# Patient Record
Sex: Male | Born: 1965 | Race: White | Hispanic: No | Marital: Single | Smoking: Current every day smoker
Health system: Southern US, Community
[De-identification: ages and names within clinical notes are randomized; demographics above are authoritative.]

---

## 2014-02-13 ENCOUNTER — Encounter (HOSPITAL_COMMUNITY): Payer: Self-pay | Admitting: *Deleted

## 2014-02-13 ENCOUNTER — Emergency Department (HOSPITAL_COMMUNITY)
Admission: EM | Admit: 2014-02-13 | Discharge: 2014-02-14 | Disposition: A | Discharge status: Home / Self Care | Payer: Self-pay | Attending: Emergency Medicine | Admitting: Emergency Medicine

## 2014-02-13 DIAGNOSIS — S0081XA Abrasion of other part of head, initial encounter: Secondary | ICD-10-CM | POA: Insufficient documentation

## 2014-02-13 DIAGNOSIS — Z720 Tobacco use: Secondary | ICD-10-CM | POA: Insufficient documentation

## 2014-02-13 DIAGNOSIS — Y9389 Activity, other specified: Secondary | ICD-10-CM | POA: Insufficient documentation

## 2014-02-13 DIAGNOSIS — M79645 Pain in left finger(s): Secondary | ICD-10-CM

## 2014-02-13 DIAGNOSIS — Y998 Other external cause status: Secondary | ICD-10-CM | POA: Insufficient documentation

## 2014-02-13 DIAGNOSIS — Z23 Encounter for immunization: Secondary | ICD-10-CM | POA: Insufficient documentation

## 2014-02-13 DIAGNOSIS — T148XXA Other injury of unspecified body region, initial encounter: Secondary | ICD-10-CM

## 2014-02-13 DIAGNOSIS — Y9289 Other specified places as the place of occurrence of the external cause: Secondary | ICD-10-CM | POA: Insufficient documentation

## 2014-02-13 DIAGNOSIS — S0083XA Contusion of other part of head, initial encounter: Secondary | ICD-10-CM | POA: Insufficient documentation

## 2014-02-13 DIAGNOSIS — S60312A Abrasion of left thumb, initial encounter: Secondary | ICD-10-CM | POA: Insufficient documentation
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
## 2014-02-14 ENCOUNTER — Emergency Department (HOSPITAL_COMMUNITY): Payer: Self-pay

## 2014-02-14 MED ORDER — HYDROCODONE-ACETAMINOPHEN 5-325 MG PO TABS
1.0000 | ORAL_TABLET | Freq: Once | ORAL | Status: AC
  Administered 2014-02-14: 1 via ORAL
  Filled 2014-02-14: qty 1

## 2014-02-14 MED ORDER — HYDROCODONE-ACETAMINOPHEN 5-325 MG PO TABS: 1.0000 | ORAL_TABLET | Freq: Four times a day (QID) | ORAL | Status: DC | PRN

## 2014-02-14 MED ORDER — TETANUS-DIPHTH-ACELL PERTUSSIS 5-2.5-18.5 LF-MCG/0.5 IM SUSP
0.5000 mL | Freq: Once | INTRAMUSCULAR | Status: AC
  Administered 2014-02-14: 0.5 mL via INTRAMUSCULAR
  Filled 2014-02-14: qty 0.5

## 2014-02-14 MED ORDER — BACITRACIN ZINC 500 UNIT/GM EX OINT: 1.0000 "application " | TOPICAL_OINTMENT | Freq: Two times a day (BID) | CUTANEOUS | Status: DC
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
## 2014-02-14 NOTE — ED Notes (Signed)
Wounds to pt hand and head cleaned with sterile water and clean gauze applied.

## 2014-02-14 NOTE — ED Notes (Signed)
Ortho tech at bedside 

## 2014-02-14 NOTE — Progress Notes (Signed)
Orthopedic Tech Progress Note Patient Details:  Madelyn FlavorsLarry XXXMilliken [birth year] [id number]  Ortho Devices Type of Ortho Device: Thumb spica splint Ortho Device/Splint Interventions: Application   Haskell Flirtewsome, Manpreet Kemmer M 02/14/2014, 3:03 AM
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
## 2018-03-25 ENCOUNTER — Emergency Department (HOSPITAL_COMMUNITY): Payer: Self-pay

## 2018-03-25 ENCOUNTER — Emergency Department (HOSPITAL_COMMUNITY)
Admission: EM | Admit: 2018-03-25 | Discharge: 2018-03-25 | Disposition: A | Discharge status: Home / Self Care | Payer: Self-pay | Attending: Emergency Medicine | Admitting: Emergency Medicine

## 2018-03-25 DIAGNOSIS — Z23 Encounter for immunization: Secondary | ICD-10-CM | POA: Insufficient documentation

## 2018-03-25 DIAGNOSIS — M10061 Idiopathic gout, right knee: Secondary | ICD-10-CM | POA: Insufficient documentation

## 2018-03-25 DIAGNOSIS — Z79899 Other long term (current) drug therapy: Secondary | ICD-10-CM | POA: Insufficient documentation

## 2018-03-25 DIAGNOSIS — F17200 Nicotine dependence, unspecified, uncomplicated: Secondary | ICD-10-CM | POA: Insufficient documentation

## 2018-03-25 DIAGNOSIS — L98499 Non-pressure chronic ulcer of skin of other sites with unspecified severity: Secondary | ICD-10-CM | POA: Insufficient documentation

## 2018-03-25 LAB — BASIC METABOLIC PANEL
ANION GAP: 11 (ref 5–15)
BUN: 9 mg/dL (ref 6–20)
CHLORIDE: 100 mmol/L (ref 98–111)
CO2: 25 mmol/L (ref 22–32)
Calcium: 9.1 mg/dL (ref 8.9–10.3)
Creatinine, Ser: 0.8 mg/dL (ref 0.61–1.24)
GFR calc Af Amer: >60 mL/min (ref >60)
GFR calc non Af Amer: >60 mL/min (ref >60)
GLUCOSE: 131 mg/dL — AB (ref 70–99)
Potassium: 4.2 mmol/L (ref 3.5–5.1)
Sodium: 136 mmol/L (ref 135–145)

## 2018-03-25 LAB — CBC WITH DIFFERENTIAL/PLATELET
Abs Immature Granulocytes: 0.06 10*3/uL (ref 0.00–0.07)
Basophils Absolute: 0 10*3/uL (ref 0.0–0.1)
Basophils Relative: 0 %
Eosinophils Absolute: 0 10*3/uL (ref 0.0–0.5)
Eosinophils Relative: 0 %
HCT: 43.2 % (ref 39.0–52.0)
Hemoglobin: 14.8 g/dL (ref 13.0–17.0)
Immature Granulocytes: 1 %
Lymphocytes Relative: 7 %
Lymphs Abs: 0.9 10*3/uL (ref 0.7–4.0)
MCH: 34.6 pg — ABNORMAL HIGH (ref 26.0–34.0)
MCHC: 34.3 g/dL (ref 30.0–36.0)
MCV: 100.9 fL — ABNORMAL HIGH (ref 80.0–100.0)
Monocytes Absolute: 1.3 10*3/uL — ABNORMAL HIGH (ref 0.1–1.0)
Monocytes Relative: 10 %
Neutro Abs: 10.8 10*3/uL — ABNORMAL HIGH (ref 1.7–7.7)
Neutrophils Relative %: 82 %
Platelets: 255 10*3/uL (ref 150–400)
RBC: 4.28 MIL/uL (ref 4.22–5.81)
RDW: 13.1 % (ref 11.5–15.5)
WBC: 13.1 10*3/uL — ABNORMAL HIGH (ref 4.0–10.5)
nRBC: 0 % (ref 0.0–0.2)

## 2018-03-25 LAB — SYNOVIAL CELL COUNT + DIFF, W/ CRYSTALS
Lymphocytes-Synovial Fld: 1 % (ref 0–20)
Monocyte-Macrophage-Synovial Fluid: 3 % — ABNORMAL LOW (ref 50–90)
Neutrophil, Synovial: 96 % — ABNORMAL HIGH (ref 0–25)
WBC, Synovial: 33600 /mm3 — ABNORMAL HIGH (ref 0–200)

## 2018-03-25 LAB — URIC ACID: URIC ACID, SERUM: 4.7 mg/dL (ref 3.7–8.6)

## 2018-03-25 LAB — CBG MONITORING, ED: GLUCOSE-CAPILLARY: 102 mg/dL — AB (ref 70–99)

## 2018-03-25 MED ORDER — TETANUS-DIPHTHERIA TOXOIDS TD 5-2 LFU IM INJ
0.5000 mL | INJECTION | Freq: Once | INTRAMUSCULAR | Status: AC
  Administered 2018-03-25: 0.5 mL via INTRAMUSCULAR
  Filled 2018-03-25: qty 0.5

## 2018-03-25 MED ORDER — COLCHICINE 0.6 MG PO TABS: 0.6000 mg | ORAL_TABLET | Freq: Two times a day (BID) | ORAL | 0 refills | Status: DC

## 2018-03-25 MED ORDER — OXYCODONE-ACETAMINOPHEN 5-325 MG PO TABS: 1.0000 | ORAL_TABLET | ORAL | 0 refills | Status: DC | PRN

## 2018-03-25 MED ORDER — LIDOCAINE-EPINEPHRINE (PF) 2 %-1:200000 IJ SOLN
30.0000 mL | Freq: Once | INTRAMUSCULAR | Status: AC
  Administered 2018-03-25: 30 mL via INTRADERMAL
  Filled 2018-03-25: qty 40

## 2018-03-25 MED ORDER — DOXYCYCLINE HYCLATE 100 MG PO CAPS: 100.0000 mg | ORAL_CAPSULE | Freq: Two times a day (BID) | ORAL | 0 refills | Status: DC

## 2018-03-25 MED ORDER — OXYCODONE-ACETAMINOPHEN 5-325 MG PO TABS
2.0000 | ORAL_TABLET | Freq: Once | ORAL | Status: AC
  Administered 2018-03-25: 2 via ORAL
  Filled 2018-03-25: qty 2

## 2018-03-25 MED ORDER — SODIUM CHLORIDE 0.9 % IV SOLN
INTRAVENOUS | Status: DC
  Administered 2018-03-25: 11:00:00 via INTRAVENOUS

## 2018-03-25 NOTE — ED Notes (Signed)
ED Provider at bedside. 
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
## 2018-03-25 NOTE — ED Triage Notes (Signed)
Injury to right ankle and pain now radiating to right knee. +swelling and hot to touch.

## 2018-03-25 NOTE — ED Provider Notes (Signed)
[facility]-EMERGENCY DEPT Provider Note   CSN: [id number] Arrival date & time: 03/25/18  7846    History   Chief Complaint Chief Complaint  Patient presents with  . Joint Swelling    HPI Dalton Kelley is a 53 y.o. male.     53 year old male presents with pain to his right knee and right ankle.  5 days ago patient scraped the inside portion of his right ankle while at work.  Since that time the pain is gone up into his right knee which she characterizes as sharp.  No fever or chills.  Symptoms are worse with standing.  Denies any hip discomfort.  No other joint pain.  Original injury to his inner right ankle has now turned into ulcer but denies any drainage from the wound currently.  Has been medicating with topical antibiotics.     No past medical history on file.  There are no active problems to display for this patient.   No past surgical history on file.      Home Medications    Prior to Admission medications   Medication Sig Start Date End Date Taking? Authorizing Provider  Aspirin-Salicylamide-Caffeine (BC HEADACHE POWDER PO) Take 1 packet by mouth as needed (headache/pain).   Yes [provider]  bacitracin ointment Apply 1 application topically 2 (two) times daily. Patient not taking: Reported on 03/25/2018 02/14/14   [name], MD  HYDROcodone-acetaminophen (NORCO/VICODIN) 5-325 MG per tablet Take 1 tablet by mouth every 6 (six) hours as needed. Patient not taking: Reported on 03/25/2018 02/14/14   [name], MD    Family History No family history on file.  Social History Social History   Tobacco Use  . Smoking status: Current Every Day Smoker  Substance Use Topics  . Alcohol use: Not on file  . Drug use: Not on file     Allergies   Patient has no known allergies.   Review of Systems Review of Systems  All other systems reviewed and are negative.    Physical Exam Updated Vital Signs BP (!) 135/115  (BP Location: Left Arm)   Pulse (!) 109   Temp 97.8 F (36.6 C) (Oral)   Resp 18   SpO2 100%   Physical Exam Vitals signs and nursing note reviewed.  Constitutional:      General: He is not in acute distress.    Appearance: Normal appearance. He is well-developed. He is not toxic-appearing.  HENT:     Head: Normocephalic and atraumatic.  Eyes:     General: Lids are normal.     Conjunctiva/sclera: Conjunctivae normal.     Pupils: Pupils are equal, round, and reactive to light.  Neck:     Musculoskeletal: Normal range of motion and neck supple.     Thyroid: No thyroid mass.     Trachea: No tracheal deviation.  Cardiovascular:     Rate and Rhythm: Normal rate and regular rhythm.     Heart sounds: Normal heart sounds. No murmur. No gallop.   Pulmonary:     Effort: Pulmonary effort is normal. No respiratory distress.     Breath sounds: Normal breath sounds. No stridor. No decreased breath sounds, wheezing, rhonchi or rales.  Abdominal:     General: Bowel sounds are normal. There is no distension.     Palpations: Abdomen is soft.     Tenderness: There is no abdominal tenderness. There is no rebound.  Musculoskeletal:        General: No tenderness.  Right knee: He exhibits decreased range of motion, swelling and effusion.       Feet:  Skin:    General: Skin is warm and dry.     Findings: No abrasion or rash.  Neurological:     Mental Status: He is alert and oriented to person, place, and time.     GCS: GCS eye subscore is 4. GCS verbal subscore is 5. GCS motor subscore is 6.     Cranial Nerves: No cranial nerve deficit.     Sensory: No sensory deficit.  Psychiatric:        Speech: Speech normal.        Behavior: Behavior normal.      ED Treatments / Results  Labs (all labs ordered are listed, but only abnormal results are displayed) Labs Reviewed  BODY FLUID CULTURE  GRAM STAIN  CBC WITH DIFFERENTIAL/PLATELET  BASIC METABOLIC PANEL  SYNOVIAL CELL COUNT + DIFF,  W/ CRYSTALS  GLUCOSE, BODY FLUID OTHER  URIC ACID  CBG MONITORING, ED    EKG None  Radiology No results found.  Procedures .Joint Aspiration/Arthrocentesis Date/Time: 03/25/2018 11:44 AM Performed by: Lorre Nick, MD Authorized by: Lorre Nick, MD   Consent:    Consent obtained:  Verbal   Consent given by:  Patient   Risks discussed:  Infection   Alternatives discussed:  No treatment Location:    Location:  Knee   Knee:  R knee Anesthesia (see MAR for exact dosages):    Anesthesia method:  Local infiltration   Local anesthetic:  Lidocaine 1% w/o epi Procedure details:    Preparation: Patient was prepped and draped in usual sterile fashion     Needle gauge:  18 G   Ultrasound guidance: no     Approach:  Lateral   Aspirate amount:  35ml   Aspirate characteristics:  Yellow   Steroid injected: no     Specimen collected: yes   Post-procedure details:    Dressing:  Adhesive bandage   Patient tolerance of procedure:  Tolerated well, no immediate complications   (including critical care time)  Medications Ordered in ED Medications  lidocaine-EPINEPHrine (XYLOCAINE W/EPI) 2 %-1:200000 (PF) injection 30 mL (has no administration in time range)  0.9 %  sodium chloride infusion (has no administration in time range)  tetanus & diphtheria toxoids (adult) (TENIVAC) injection 0.5 mL (has no administration in time range)     Initial Impression / Assessment and Plan / ED Course  I have reviewed the triage vital signs and the nursing notes.  Pertinent labs & imaging results that were available during my care of the patient were reviewed by me and considered in my medical decision making (see chart for details).        Patients laceration consistent with gout.  Treated with pain medications here.  Will place patient on colchicine as well as Percocet.  We will also treat patient's ulcer with a course of oral antibiotics and give referral to the wound care  center  Final Clinical Impressions(s) / ED Diagnoses   Final diagnoses:  None    ED Discharge Orders    None       Lorre Nick, MD 03/25/18 1529

## 2018-03-26 LAB — GLUCOSE, BODY FLUID OTHER: GLUCOSE, BODY FLUID OTHER: 40 mg/dL

## 2018-03-28 LAB — BODY FLUID CULTURE
Culture: NO GROWTH
Gram Stain: NONE SEEN
Special Requests: NORMAL

## 2020-06-27 IMAGING — CR RIGHT KNEE - COMPLETE 4+ VIEW
3 series · 3 of 3 positions shown · non-contrast
Comparison: None.

CLINICAL DATA: Right knee pain.

EXAM:
RIGHT KNEE - COMPLETE 4+ VIEW

[x knee ap right (1 of 3)]
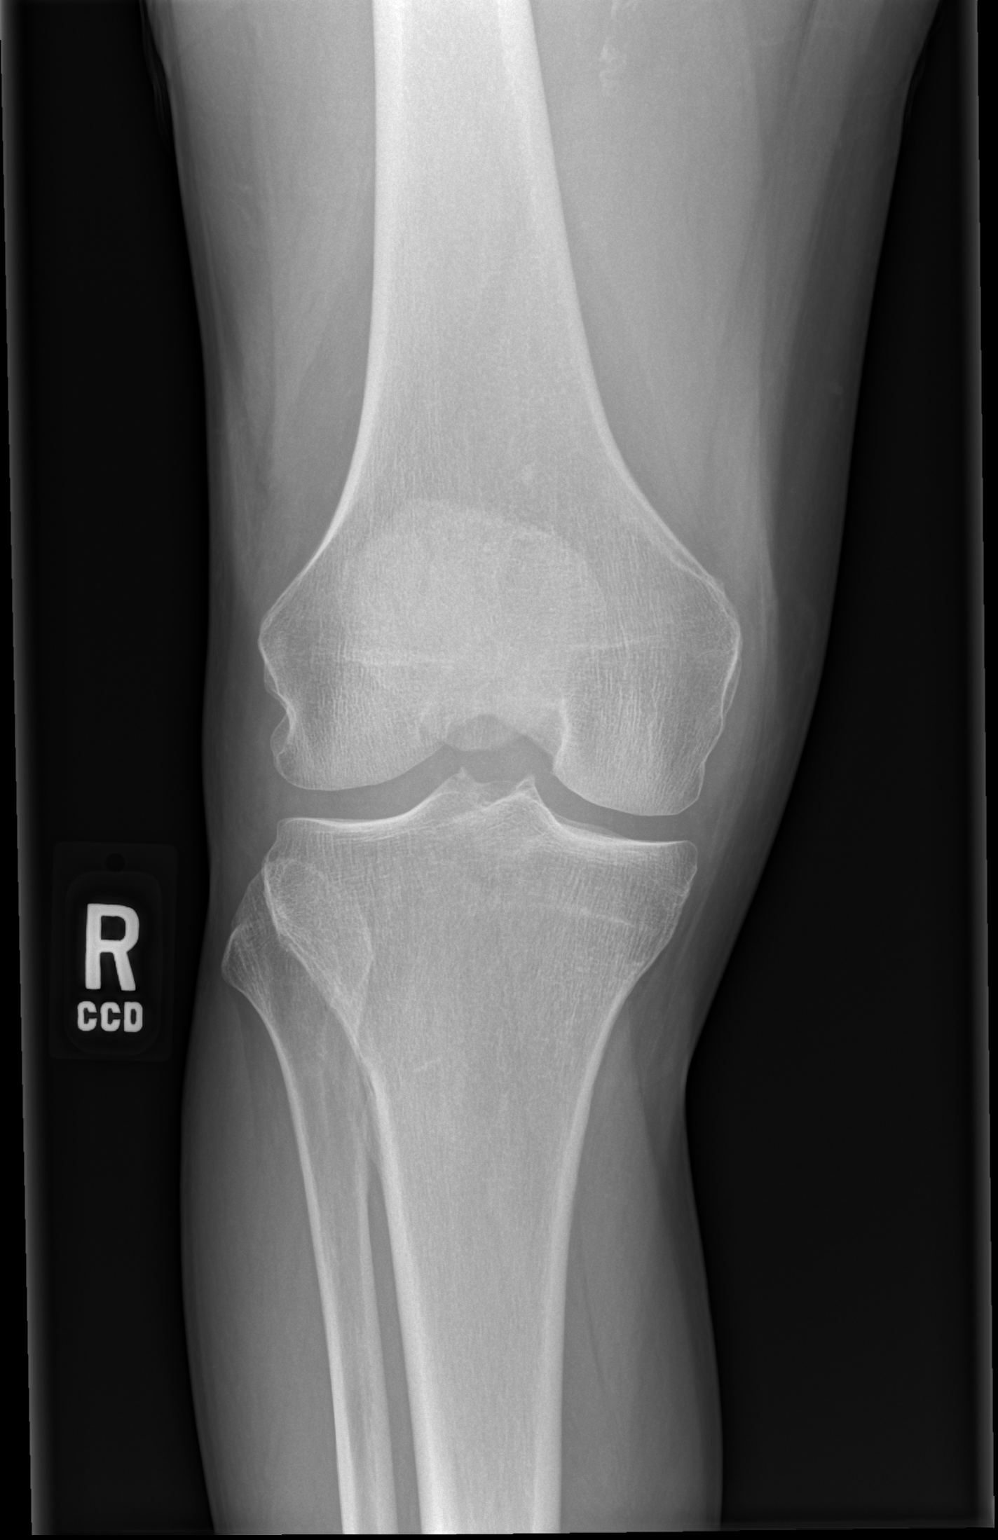

[x knee ap right (2 of 3)]
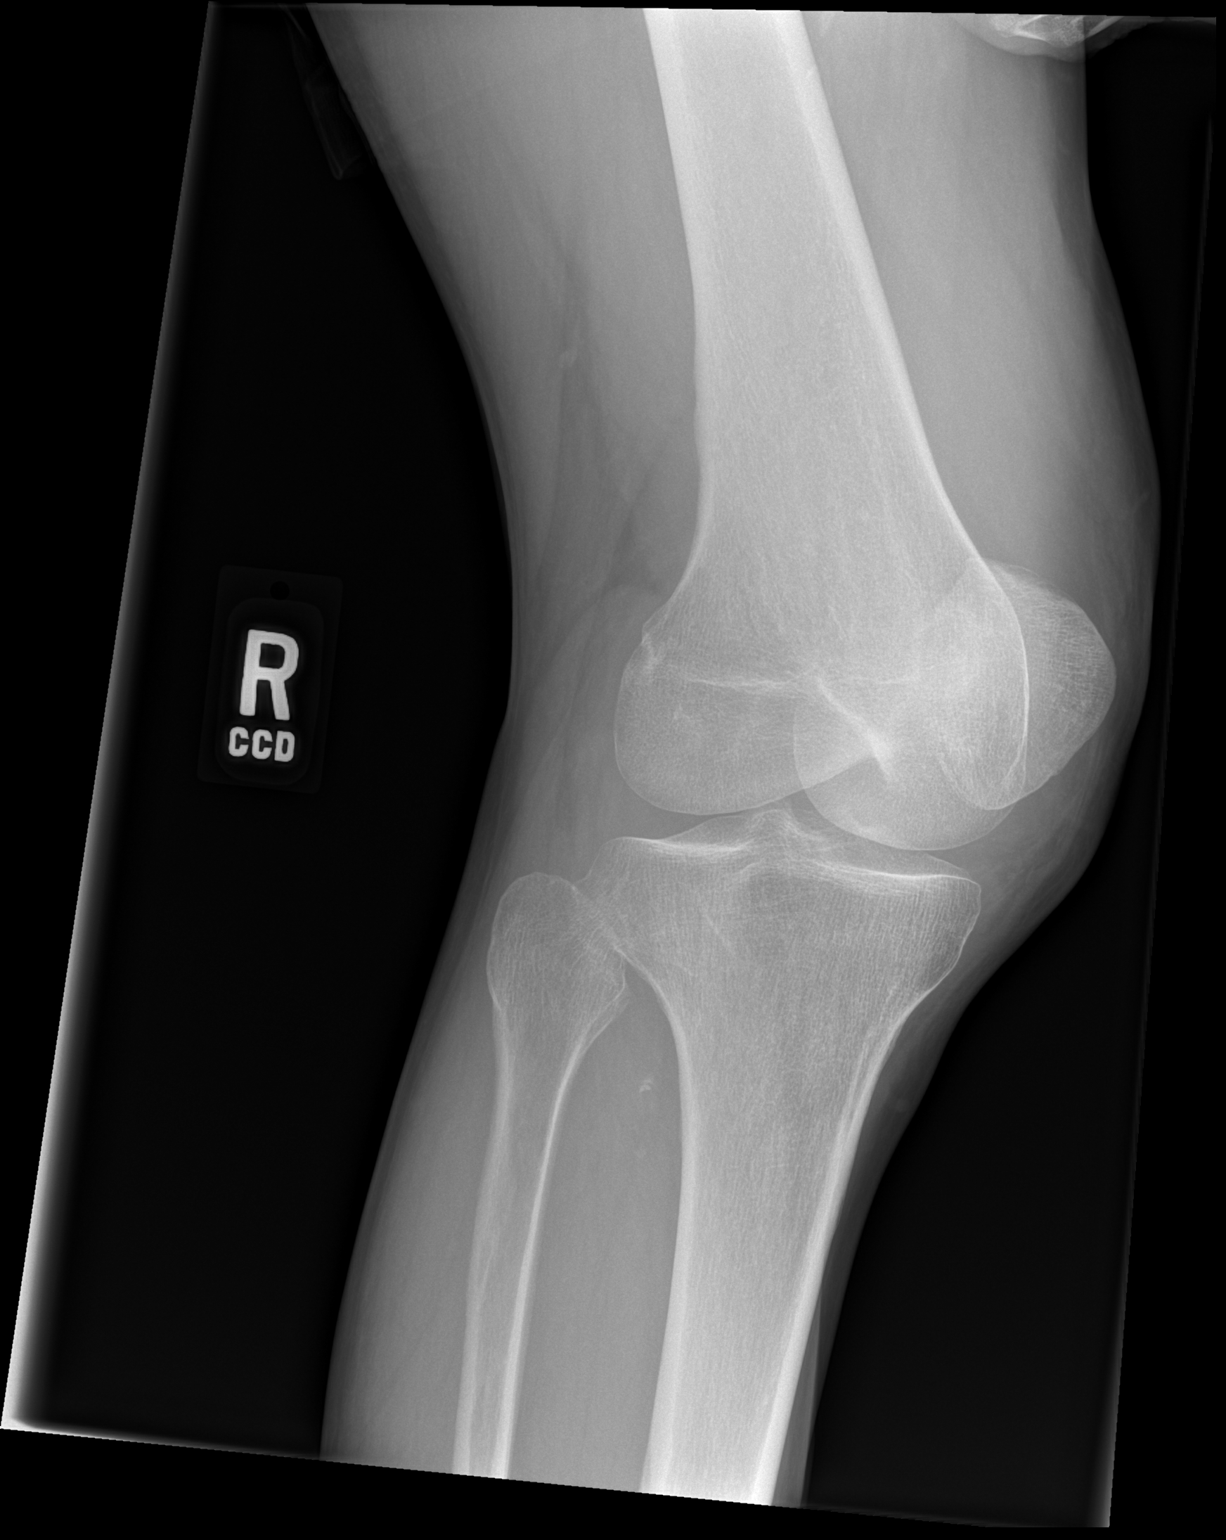

[x knee ap right (3 of 3)]
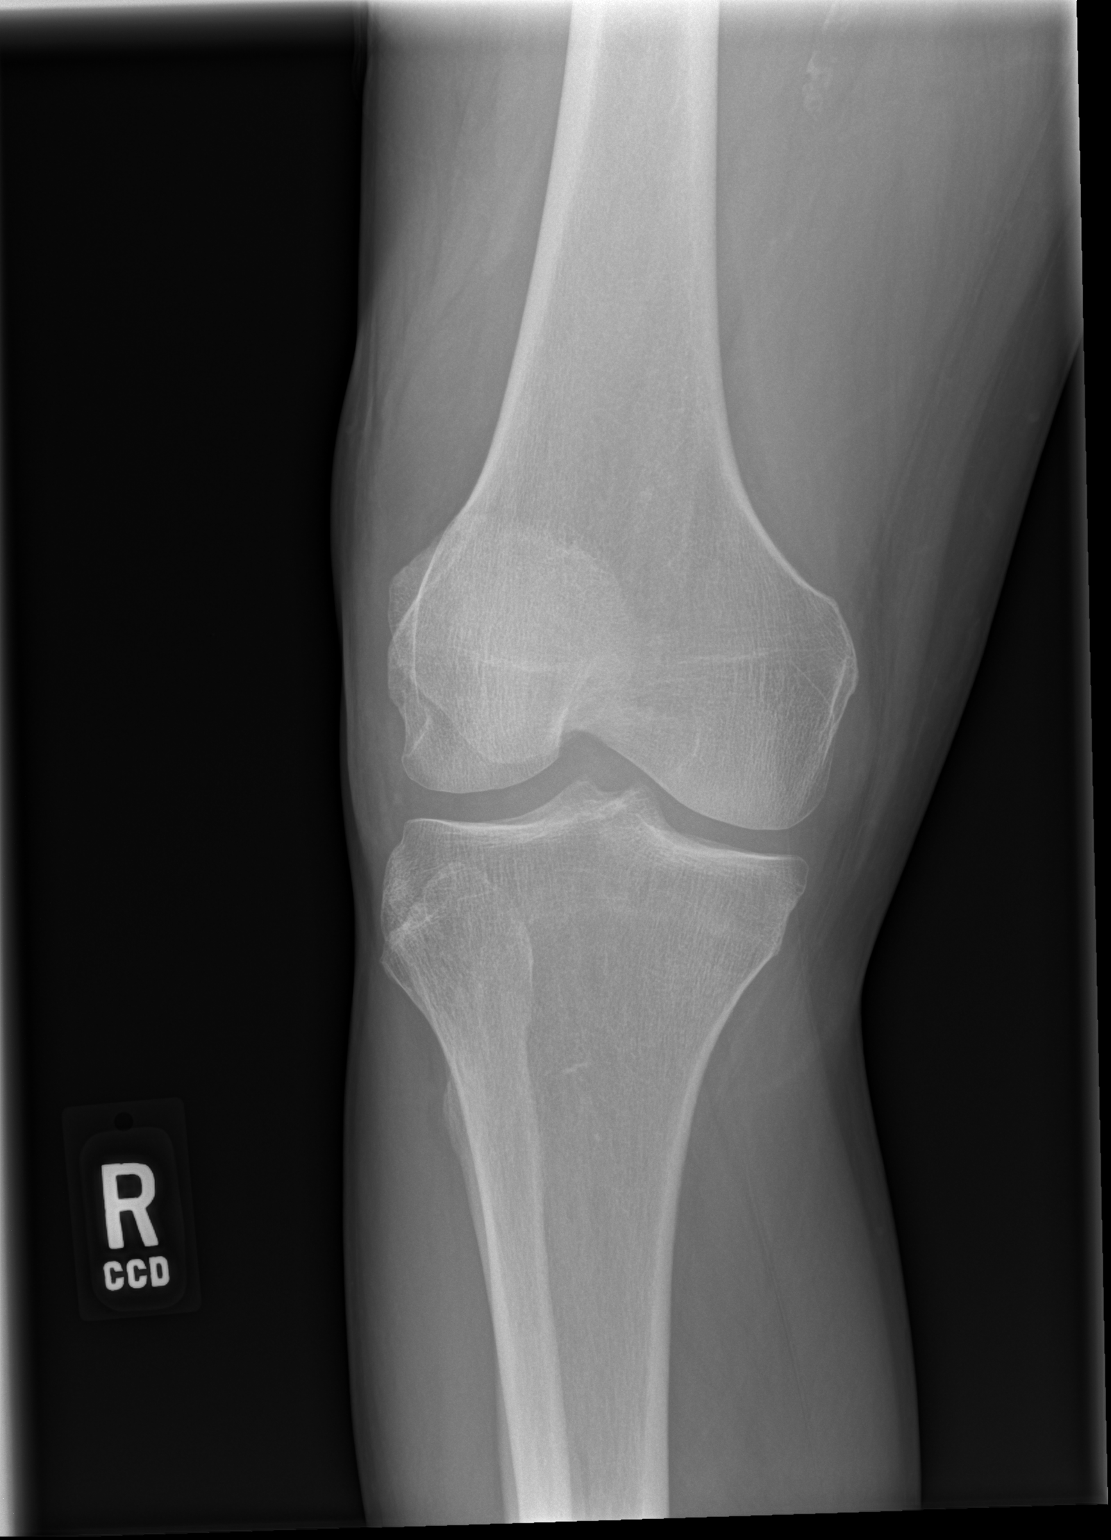

[3 of 3 positions shown; findings below may reference images not displayed]

FINDINGS: The joint spaces are maintained. No acute bony findings,
osteochondral lesion or degenerative changes. There is a large joint
effusion. Internal derangement would be possible. Vascular
calcifications are noted, advanced for age.
IMPRESSION: No acute bony findings or significant degenerative changes.

Large joint effusion.

## 2020-06-27 IMAGING — CR RIGHT ANKLE - COMPLETE 3+ VIEW
3 series · 3 of 3 positions shown · non-contrast
Comparison: None.

CLINICAL DATA: Right ankle pain.

EXAM:
RIGHT ANKLE - COMPLETE 3+ VIEW

[x ankle ap right]
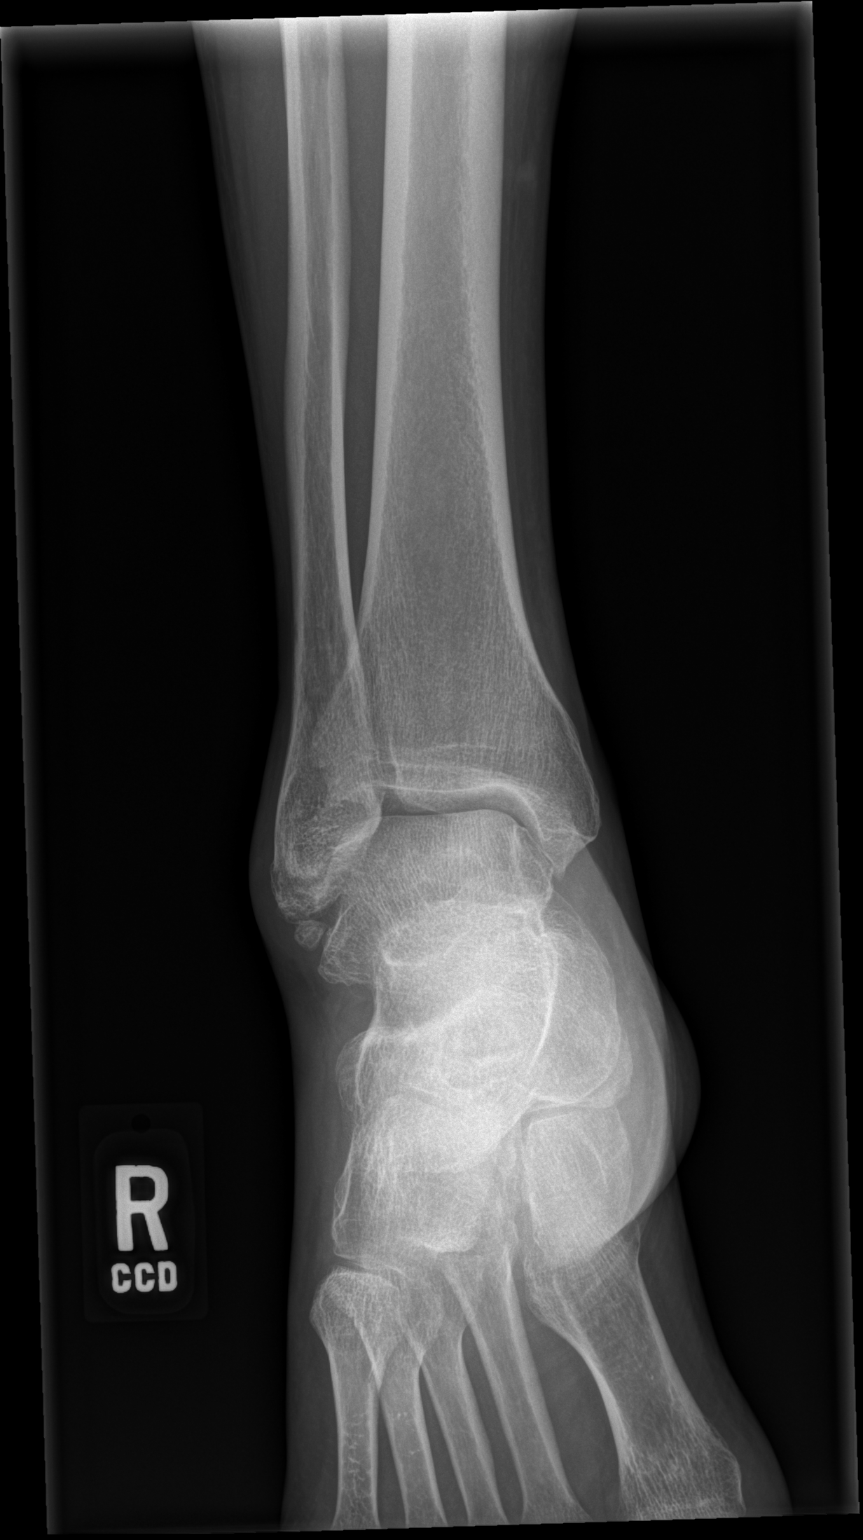

[x ankle obl right]
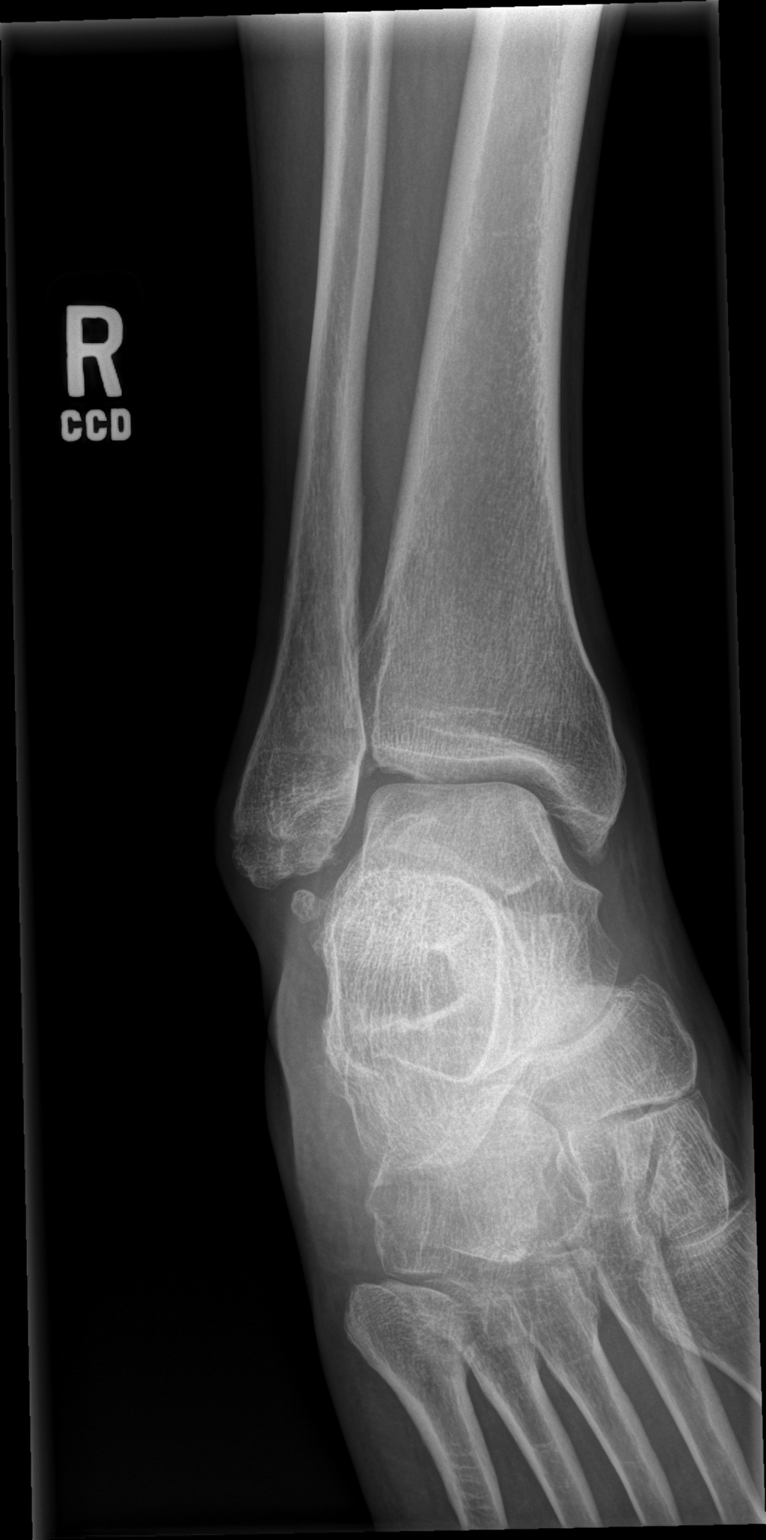

[x ankle lat right]
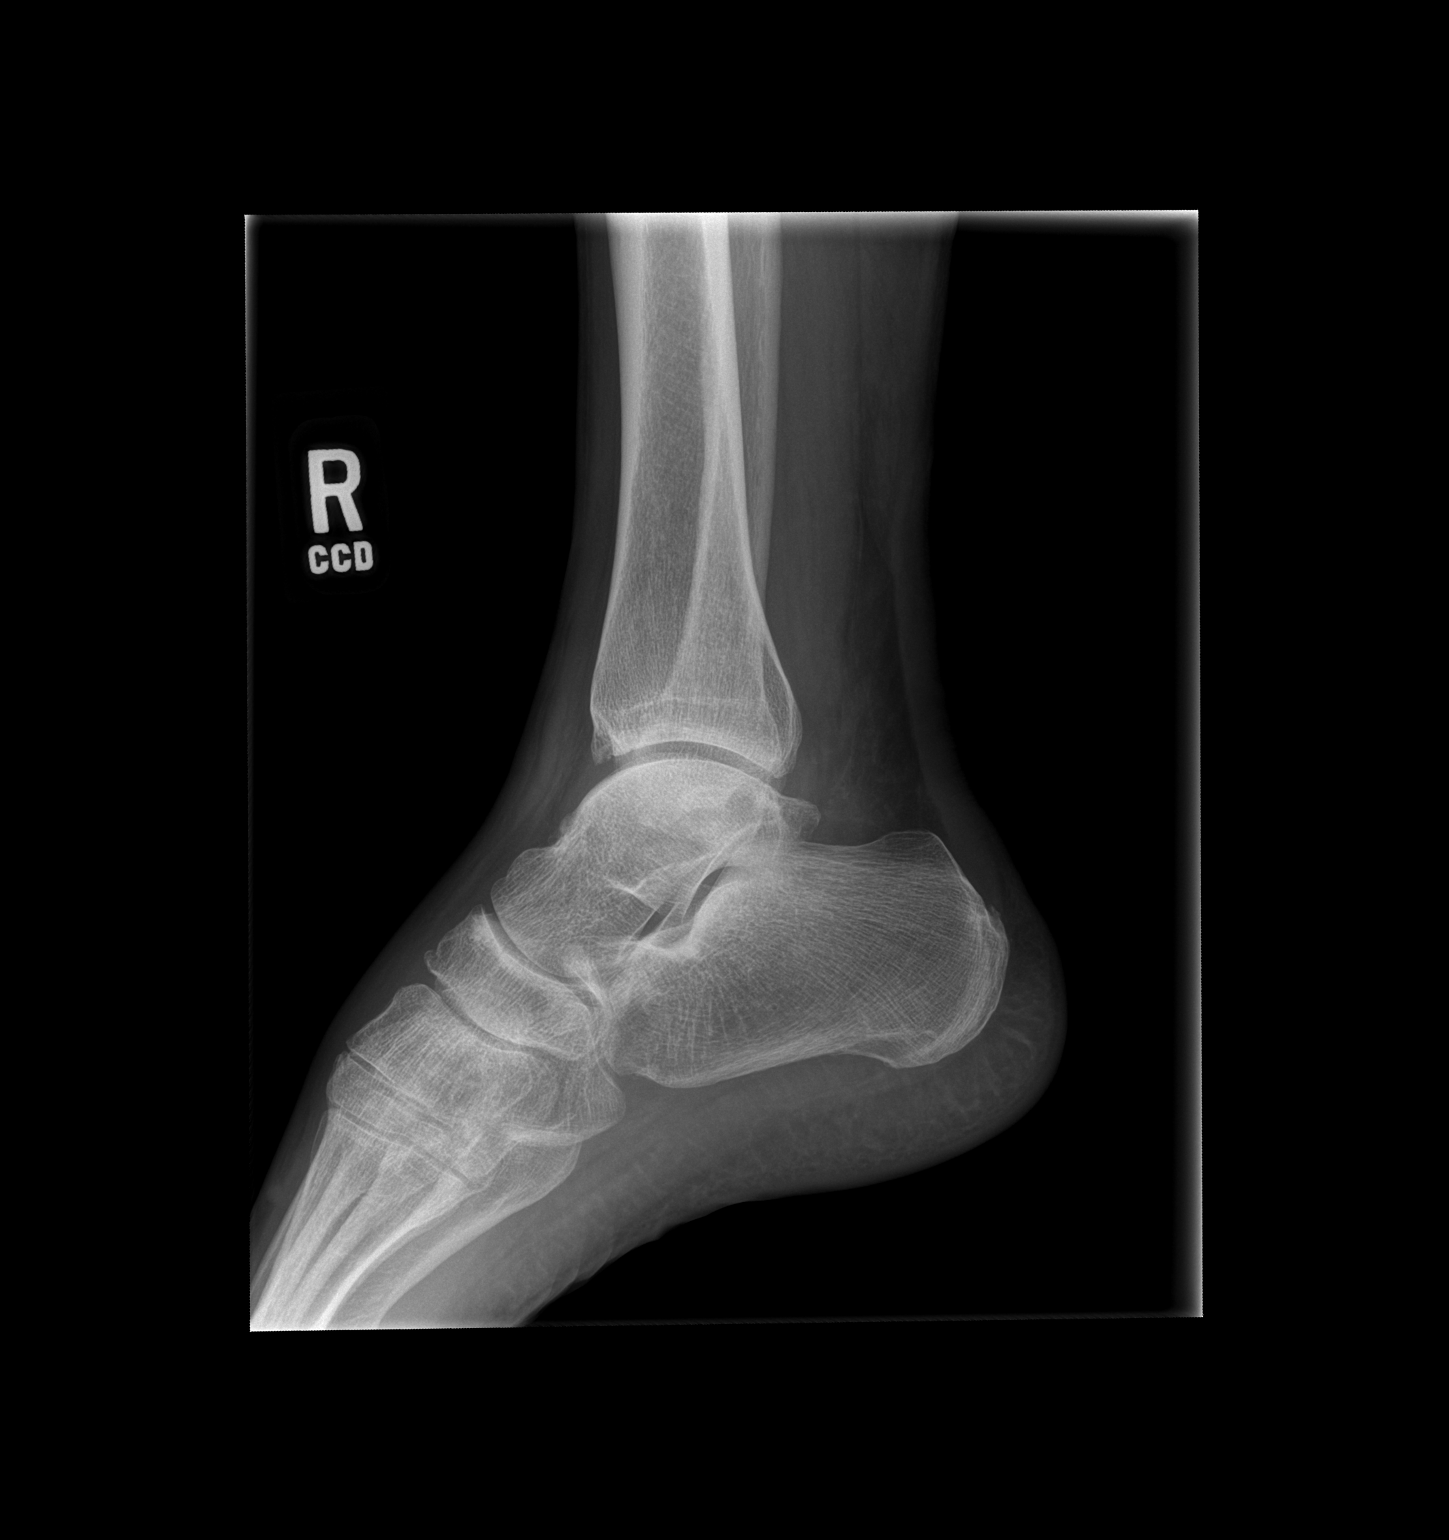

[3 of 3 positions shown; findings below may reference images not displayed]

FINDINGS: The ankle mortise is maintained. There are mild ankle joint
degenerative changes. Rounded density along the distal tip of the
lateral malleolus likely an old avulsion fracture or unfused
secondary ossification center. Vague lucency horizontally through
the lower aspect of the lateral malleolus suspicious for a healed or
healing fracture. I do not think this is an acute fracture and there
is no adjacent soft tissue swelling.

The mid and hindfoot bony structures are intact. Mild degenerative
changes.
IMPRESSION: 1. Ankle joint degenerative changes.
2. Suspect healed or healing distal fibular fracture. No definite
acute fracture.

## 2022-09-15 ENCOUNTER — Encounter (HOSPITAL_COMMUNITY): Payer: Self-pay | Admitting: Emergency Medicine

## 2022-09-15 ENCOUNTER — Other Ambulatory Visit: Payer: Self-pay

## 2022-09-15 ENCOUNTER — Emergency Department (HOSPITAL_COMMUNITY): Payer: 59

## 2022-09-15 ENCOUNTER — Emergency Department (HOSPITAL_COMMUNITY)
Admission: EM | Admit: 2022-09-15 | Discharge: 2022-09-16 | Disposition: A | Discharge status: Home / Self Care | Payer: 59 | Attending: Emergency Medicine | Admitting: Emergency Medicine

## 2022-09-15 DIAGNOSIS — D692 Other nonthrombocytopenic purpura: Secondary | ICD-10-CM

## 2022-09-15 DIAGNOSIS — M10062 Idiopathic gout, left knee: Secondary | ICD-10-CM

## 2022-09-15 DIAGNOSIS — R062 Wheezing: Secondary | ICD-10-CM | POA: Insufficient documentation

## 2022-09-15 DIAGNOSIS — Z20822 Contact with and (suspected) exposure to covid-19: Secondary | ICD-10-CM | POA: Diagnosis not present

## 2022-09-15 DIAGNOSIS — L03116 Cellulitis of left lower limb: Secondary | ICD-10-CM | POA: Diagnosis not present

## 2022-09-15 DIAGNOSIS — M25562 Pain in left knee: Secondary | ICD-10-CM | POA: Diagnosis present

## 2022-09-15 DIAGNOSIS — K521 Toxic gastroenteritis and colitis: Secondary | ICD-10-CM | POA: Diagnosis not present

## 2022-09-15 DIAGNOSIS — R030 Elevated blood-pressure reading, without diagnosis of hypertension: Secondary | ICD-10-CM | POA: Diagnosis not present

## 2022-09-15 DIAGNOSIS — M25462 Effusion, left knee: Secondary | ICD-10-CM

## 2022-09-15 DIAGNOSIS — F17210 Nicotine dependence, cigarettes, uncomplicated: Secondary | ICD-10-CM | POA: Diagnosis not present

## 2022-09-15 DIAGNOSIS — E876 Hypokalemia: Secondary | ICD-10-CM | POA: Diagnosis not present

## 2022-09-15 DIAGNOSIS — M109 Gout, unspecified: Secondary | ICD-10-CM

## 2022-09-15 LAB — RESP PANEL BY RT-PCR (RSV, FLU A&B, COVID)  RVPGX2
Influenza A by PCR: NEGATIVE
Influenza B by PCR: NEGATIVE
Resp Syncytial Virus by PCR: NEGATIVE
SARS Coronavirus 2 by RT PCR: NEGATIVE

## 2022-09-15 LAB — COMPREHENSIVE METABOLIC PANEL
ALT: 38 U/L (ref 0–44)
AST: 68 U/L — ABNORMAL HIGH (ref 15–41)
Albumin: 3.1 g/dL — ABNORMAL LOW (ref 3.5–5.0)
Alkaline Phosphatase: 111 U/L (ref 38–126)
Anion gap: 14 (ref 5–15)
BUN: 12 mg/dL (ref 6–20)
CO2: 26 mmol/L (ref 22–32)
Calcium: 8.7 mg/dL — ABNORMAL LOW (ref 8.9–10.3)
Chloride: 95 mmol/L — ABNORMAL LOW (ref 98–111)
Creatinine, Ser: 1 mg/dL (ref 0.61–1.24)
GFR, Estimated: >60 mL/min (ref >60)
Glucose, Bld: 103 mg/dL — ABNORMAL HIGH (ref 70–99)
Potassium: 3.3 mmol/L — ABNORMAL LOW (ref 3.5–5.1)
Sodium: 135 mmol/L (ref 135–145)
Total Bilirubin: 0.7 mg/dL (ref 0.3–1.2)
Total Protein: 8.5 g/dL — ABNORMAL HIGH (ref 6.5–8.1)

## 2022-09-15 LAB — PROTIME-INR
INR: 0.9 (ref 0.8–1.2)
Prothrombin Time: 12.8 seconds (ref 11.4–15.2)

## 2022-09-15 LAB — CBC
HCT: 41.8 % (ref 39.0–52.0)
Hemoglobin: 14.4 g/dL (ref 13.0–17.0)
MCH: 35.7 pg — ABNORMAL HIGH (ref 26.0–34.0)
MCHC: 34.4 g/dL (ref 30.0–36.0)
MCV: 103.7 fL — ABNORMAL HIGH (ref 80.0–100.0)
Platelets: 351 10*3/uL (ref 150–400)
RBC: 4.03 MIL/uL — ABNORMAL LOW (ref 4.22–5.81)
RDW: 15.9 % — ABNORMAL HIGH (ref 11.5–15.5)
WBC: 10.4 10*3/uL (ref 4.0–10.5)
nRBC: 0 % (ref 0.0–0.2)

## 2022-09-15 LAB — LIPASE, BLOOD: Lipase: 28 U/L (ref 11–51)

## 2022-09-15 LAB — I-STAT CG4 LACTIC ACID, ED: Lactic Acid, Venous: 1 mmol/L (ref 0.5–1.9)

## 2022-09-15 MED ORDER — LIDOCAINE HCL (PF) 1 % IJ SOLN
10.0000 mL | Freq: Once | INTRAMUSCULAR | Status: AC
  Administered 2022-09-15: 10 mL
  Filled 2022-09-15: qty 30

## 2022-09-15 MED ORDER — LACTATED RINGERS IV SOLN: INTRAVENOUS | Status: DC

## 2022-09-15 NOTE — ED Provider Notes (Incomplete)
[place] EMERGENCY DEPARTMENT AT [facility] Provider Note   CSN: [id number] Arrival date & time: 09/15/22  1707     History {Add pertinent medical, surgical, social history, OB history to HPI:1} Chief Complaint  Patient presents with   Diarrhea    Dalton Kelley is a 57 y.o. male.  HPI     Home Medications Prior to Admission medications   Medication Sig Start Date End Date Taking? Authorizing Provider  Aspirin-Salicylamide-Caffeine (BC HEADACHE POWDER PO) Take 1 packet by mouth as needed (headache/pain).    [provider]  bacitracin ointment Apply 1 application topically 2 (two) times daily. Patient not taking: Reported on 03/25/2018 02/14/14   [name], MD  colchicine 0.6 MG tablet Take 1 tablet (0.6 mg total) by mouth 2 (two) times daily. 03/25/18   Lorre Nick, MD  doxycycline (VIBRAMYCIN) 100 MG capsule Take 1 capsule (100 mg total) by mouth 2 (two) times daily. 03/25/18   Lorre Nick, MD  HYDROcodone-acetaminophen (NORCO/VICODIN) 5-325 MG per tablet Take 1 tablet by mouth every 6 (six) hours as needed. Patient not taking: Reported on 03/25/2018 02/14/14   [name], MD  oxyCODONE-acetaminophen (PERCOCET/ROXICET) 5-325 MG tablet Take 1-2 tablets by mouth every 4 (four) hours as needed for severe pain. 03/25/18   Lorre Nick, MD      Allergies    Patient has no known allergies.    Review of Systems   Review of Systems  Physical Exam Updated Vital Signs BP (!) 149/102 Comment: nurse notified  Pulse 98   Temp (!) 97.5 F (36.4 C) (Oral)   Resp 12   SpO2 99%  Physical Exam  ED Results / Procedures / Treatments   Labs (all labs ordered are listed, but only abnormal results are displayed) Labs Reviewed  LIPASE, BLOOD  COMPREHENSIVE METABOLIC PANEL  CBC  URINALYSIS, ROUTINE W REFLEX MICROSCOPIC    EKG None  Radiology DG Wrist Complete Right  Result Date: 09/15/2022 CLINICAL DATA:  Pain and swelling EXAM: RIGHT  WRIST - COMPLETE 3+ VIEW COMPARISON:  None Available. FINDINGS: No acute fracture or dislocation. Remote posttraumatic deformity fifth metacarpal head. Degenerative changes at the radiocarpal joint. IMPRESSION: No acute fracture or dislocation. Electronically Signed   By: Minerva Fester M.D.   On: 09/15/2022 19:43   DG Knee Complete 4 Views Left  Result Date: 09/15/2022 CLINICAL DATA:  Pain and swelling. Dropped a 30 pound drum a Freon onto left knee. EXAM: LEFT KNEE - COMPLETE 4+ VIEW COMPARISON:  None Available. FINDINGS: Moderate joint effusion. Diffuse decreased bone mineralization. Joint spaces are preserved. No acute fracture is seen. No dislocation. Moderate atherosclerotic calcifications. IMPRESSION: Moderate joint effusion. No acute fracture is seen. Electronically Signed   By: Neita Garnet M.D.   On: 09/15/2022 18:33    Procedures Procedures  {Document cardiac monitor, telemetry assessment procedure when appropriate:1}  Medications Ordered in ED Medications - No data to display  ED Course/ Medical Decision Making/ A&P   {   Click here for ABCD2, HEART and other calculatorsREFRESH Note before signing :1}                              Medical Decision Making Amount and/or Complexity of Data Reviewed Labs: ordered. Radiology: ordered.   ***  {Document critical care time when appropriate:1} {Document review of labs and clinical decision tools ie heart score, Chads2Vasc2 etc:1}  {Document your independent review of radiology images, and  any outside records:1} {Document your discussion with family members, caretakers, and with consultants:1} {Document social determinants of health affecting pt's care:1} {Document your decision making why or why not admission, treatments were needed:1} Final Clinical Impression(s) / ED Diagnoses Final diagnoses:  None    Rx / DC Orders ED Discharge Orders     None

## 2022-09-15 NOTE — ED Triage Notes (Signed)
Pt Bib [facility] with reports of weakness and diarrhea x 4 days. Denies fevers. Pt also reports left knee pain after dropping a 30lb drum of freon onto his left.

## 2022-09-15 NOTE — ED Provider Notes (Signed)
[place] EMERGENCY DEPARTMENT AT [facility] Provider Note   CSN: [id number] Arrival date & time: 09/15/22  1707     History {Add pertinent medical, surgical, social history, OB history to HPI:1} Chief Complaint  Patient presents with   Diarrhea    Dalton Kelley is a 57 y.o. male.  HPI Patient report that he has had increasing pain in his left knee.  He reports he initially thought it was from getting the knee hit by a canister about 12 days ago.  Reports initially it was not all that swollen and so he did not think too much of it.  He also has gout so he thought maybe the injury had triggered his gout.  He reports then over about the past 3 days it became more swollen to the point he is having difficulty walking on it.  He reports in conjunction with this he has started to have some diarrhea for about the past 4 days.  He reports is really runny and loose and has had multiple episodes.  He has not had fever or abdominal pain.  No vomiting.  Chest pain or shortness of breath.  Patient has not been seeking medical care due to lack of insurance for quite some time.  At baseline he drinks about 4 large beers per day, smokes over a pack of cigarettes per day and does not get his blood pressure checked.  Patient reports he is sexually active when possible.  He reports last time he was sexually active was about 4 weeks ago.  Noted on exam patient has a rash on the feet bilaterally.  He was not aware of this.  He is not sure if he always has this appearance or if this is new.  He reports he does not "look at his feet".  Is been no itching.  He reports that he got more concerned when the left foot also got very red and swollen and painful to the point where he is having trouble putting weight on it.  No specific injury to the foot.  Patient has been treated for gout in the past.  He reports he usually comes to the emergency department and gets medication for gout when he gets pain or swelling  in his joints.    Home Medications Prior to Admission medications   Medication Sig Start Date End Date Taking? Authorizing Provider  Aspirin-Salicylamide-Caffeine (BC HEADACHE POWDER PO) Take 1 packet by mouth as needed (headache/pain).    [provider]  bacitracin ointment Apply 1 application topically 2 (two) times daily. Patient not taking: Reported on 03/25/2018 02/14/14   [name], MD  colchicine 0.6 MG tablet Take 1 tablet (0.6 mg total) by mouth 2 (two) times daily. 03/25/18   Lorre Nick, MD  doxycycline (VIBRAMYCIN) 100 MG capsule Take 1 capsule (100 mg total) by mouth 2 (two) times daily. 03/25/18   Lorre Nick, MD  HYDROcodone-acetaminophen (NORCO/VICODIN) 5-325 MG per tablet Take 1 tablet by mouth every 6 (six) hours as needed. Patient not taking: Reported on 03/25/2018 02/14/14   [name], MD  oxyCODONE-acetaminophen (PERCOCET/ROXICET) 5-325 MG tablet Take 1-2 tablets by mouth every 4 (four) hours as needed for severe pain. 03/25/18   Lorre Nick, MD      Allergies    Patient has no known allergies.    Review of Systems   Review of Systems  Physical Exam Updated Vital Signs BP (!) 162/99 (BP Location: Right Arm)   Pulse 87  Temp 98.4 F (36.9 C) (Oral)   Resp 14   SpO2 96%  Physical Exam Constitutional:      Comments: Alert nontoxic.  Clear mental status.  No respiratory distress.  HENT:     Mouth/Throat:     Pharynx: Oropharynx is clear.  Eyes:     Extraocular Movements: Extraocular movements intact.  Cardiovascular:     Rate and Rhythm: Normal rate and regular rhythm.  Pulmonary:     Effort: Pulmonary effort is normal.     Breath sounds: Wheezing present.     Comments: Respiratory distress.  Patient does have wheezing bilaterally. Abdominal:     Palpations: Abdomen is soft.  Musculoskeletal:     Comments: Moderate to large swelling of the left knee.  Pain with flexion.  Warm to the touch erythema diffusely over the top and  lateral aspect of the left foot up to about the ankle.  See attached images.  Also a purpura rash both lower extremities to about the knees.  Skin:    General: Skin is warm and dry.  Neurological:     General: No focal deficit present.     Mental Status: He is oriented to person, place, and time.     Motor: No weakness.     Coordination: Coordination normal.  Psychiatric:        Mood and Affect: Mood normal.           ED Results / Procedures / Treatments   Labs (all labs ordered are listed, but only abnormal results are displayed) Labs Reviewed  COMPREHENSIVE METABOLIC PANEL - Abnormal; Notable for the following components:      Result Value   Potassium 3.3 (*)    Chloride 95 (*)    Glucose, Bld 103 (*)    Calcium 8.7 (*)    Total Protein 8.5 (*)    Albumin 3.1 (*)    AST 68 (*)    All other components within normal limits  CBC - Abnormal; Notable for the following components:   RBC 4.03 (*)    MCV 103.7 (*)    MCH 35.7 (*)    RDW 15.9 (*)    All other components within normal limits  RESP PANEL BY RT-PCR (RSV, FLU A&B, COVID)  RVPGX2  CULTURE, BLOOD (ROUTINE X 2)  CULTURE, BLOOD (ROUTINE X 2)  BODY FLUID CULTURE W GRAM STAIN  LIPASE, BLOOD  PROTIME-INR  URINALYSIS, ROUTINE W REFLEX MICROSCOPIC  RPR  HIV ANTIBODY (ROUTINE TESTING W REFLEX)  SYNOVIAL CELL COUNT + DIFF, W/ CRYSTALS  I-STAT CG4 LACTIC ACID, ED  GC/CHLAMYDIA PROBE AMP ([place]) NOT AT [facility]    EKG None  Radiology DG Wrist Complete Right  Result Date: 09/15/2022 CLINICAL DATA:  Pain and swelling EXAM: RIGHT WRIST - COMPLETE 3+ VIEW COMPARISON:  None Available. FINDINGS: No acute fracture or dislocation. Remote posttraumatic deformity fifth metacarpal head. Degenerative changes at the radiocarpal joint. IMPRESSION: No acute fracture or dislocation. Electronically Signed   By: Minerva Fester M.D.   On: 09/15/2022 19:43   DG Knee Complete 4 Views Left  Result Date: 09/15/2022 CLINICAL DATA:   Pain and swelling. Dropped a 30 pound drum a Freon onto left knee. EXAM: LEFT KNEE - COMPLETE 4+ VIEW COMPARISON:  None Available. FINDINGS: Moderate joint effusion. Diffuse decreased bone mineralization. Joint spaces are preserved. No acute fracture is seen. No dislocation. Moderate atherosclerotic calcifications. IMPRESSION: Moderate joint effusion. No acute fracture is seen. Electronically Signed   By: Windy Fast  Viola M.D.   On: 09/15/2022 18:33    Procedures .Joint Aspiration/Arthrocentesis  Date/Time: 09/16/2022 12:12 AM  Performed by: Arby Barrette, MD Authorized by: Arby Barrette, MD   Consent:    Consent obtained:  Verbal   Consent given by:  Patient   Risks discussed:  Bleeding, infection, pain, nerve damage and incomplete drainage Universal protocol:    Patient identity confirmed:  Verbally with patient Location:    Location:  Knee   Knee:  L knee Anesthesia:    Anesthesia method:  Local infiltration   Local anesthetic:  Lidocaine 1% w/o epi Procedure details:    Preparation: Patient was prepped and draped in usual sterile fashion     Needle gauge:  18 G   Ultrasound guidance: no     Approach:  Lateral   Aspirate amount:  40ml   Aspirate characteristics:  Yellow and cloudy   Steroid injected: no     Specimen collected: yes   Post-procedure details:    Dressing:  Adhesive bandage   Procedure completion:  Tolerated well, no immediate complications Comments:     From the joint was yellow cloudy and flocculent.   {Document cardiac monitor, telemetry assessment procedure when appropriate:1}  Medications Ordered in ED Medications  lactated ringers infusion ( Intravenous New Bag/Given 09/15/22 2139)  vancomycin (VANCOCIN) IVPB 1000 mg/200 mL premix (1,000 mg Intravenous New Bag/Given 09/16/22 0103)  lidocaine (PF) (XYLOCAINE) 1 % injection 10 mL (10 mLs Other Given by Other 09/15/22 2331)  cefTRIAXone (ROCEPHIN) 1 g in sodium chloride 0.9 % 100 mL IVPB (0 g Intravenous  Stopped 09/16/22 0053)    ED Course/ Medical Decision Making/ A&P   {   Click here for ABCD2, HEART and other calculatorsREFRESH Note before signing :1}                              Medical Decision Making Amount and/or Complexity of Data Reviewed Labs: ordered. Radiology: ordered.  Risk Prescription drug management.  Patient presents as outlined.  He has an erythematous swollen foot and left knee joint.  Patient reports diarrhea.  He had restarted his colchicine.  In conjunction with this he has a peripheral rash of the lower legs.  Concern for septic joint versus gout and cellulitis of the foot.  Patient does not have fever at this time. Consult: Reviewed with Dr. Imogene Burn for admission.  He will evaluate the patient and review joint studies for definitive plan.  {Document critical care time when appropriate:1} {Document review of labs and clinical decision tools ie heart score, Chads2Vasc2 etc:1}  {Document your independent review of radiology images, and any outside records:1} {Document your discussion with family members, caretakers, and with consultants:1} {Document social determinants of health affecting pt's care:1} {Document your decision making why or why not admission, treatments were needed:1} Final Clinical Impression(s) / ED Diagnoses Final diagnoses:  Cellulitis of left lower extremity  Effusion of left knee  Purpura (HCC)    Rx / DC Orders ED Discharge Orders     None

## 2022-09-16 DIAGNOSIS — K521 Toxic gastroenteritis and colitis: Secondary | ICD-10-CM

## 2022-09-16 DIAGNOSIS — M10062 Idiopathic gout, left knee: Secondary | ICD-10-CM

## 2022-09-16 DIAGNOSIS — M109 Gout, unspecified: Secondary | ICD-10-CM

## 2022-09-16 DIAGNOSIS — E876 Hypokalemia: Secondary | ICD-10-CM

## 2022-09-16 DIAGNOSIS — R030 Elevated blood-pressure reading, without diagnosis of hypertension: Secondary | ICD-10-CM

## 2022-09-16 LAB — RPR: RPR Ser Ql: NONREACTIVE

## 2022-09-16 LAB — URINALYSIS, ROUTINE W REFLEX MICROSCOPIC
Bilirubin Urine: NEGATIVE
Glucose, UA: NEGATIVE mg/dL
Hgb urine dipstick: NEGATIVE
Ketones, ur: NEGATIVE mg/dL
Leukocytes,Ua: NEGATIVE
Nitrite: NEGATIVE
Protein, ur: NEGATIVE mg/dL
Specific Gravity, Urine: 1.015 (ref 1.005–1.030)
pH: 6 (ref 5.0–8.0)

## 2022-09-16 LAB — SYNOVIAL CELL COUNT + DIFF, W/ CRYSTALS
Eosinophils-Synovial: 0 % (ref 0–1)
Lymphocytes-Synovial Fld: 5 % (ref 0–20)
Monocyte-Macrophage-Synovial Fluid: 4 % — ABNORMAL LOW (ref 50–90)
Neutrophil, Synovial: 91 % — ABNORMAL HIGH (ref 0–25)
WBC, Synovial: 5190 /mm3 — ABNORMAL HIGH (ref 0–200)

## 2022-09-16 LAB — HIV ANTIBODY (ROUTINE TESTING W REFLEX): HIV Screen 4th Generation wRfx: NONREACTIVE

## 2022-09-16 MED ORDER — SODIUM CHLORIDE 0.9 % IV SOLN
1.0000 g | Freq: Once | INTRAVENOUS | Status: AC
  Administered 2022-09-16: 1 g via INTRAVENOUS
  Filled 2022-09-16: qty 10

## 2022-09-16 MED ORDER — OXYCODONE HCL 5 MG PO TABS
10.0000 mg | ORAL_TABLET | Freq: Once | ORAL | Status: AC
  Administered 2022-09-16: 10 mg via ORAL
  Filled 2022-09-16: qty 2

## 2022-09-16 MED ORDER — VANCOMYCIN HCL IN DEXTROSE 1-5 GM/200ML-% IV SOLN
1000.0000 mg | Freq: Once | INTRAVENOUS | Status: AC
  Administered 2022-09-16: 1000 mg via INTRAVENOUS
  Filled 2022-09-16: qty 200

## 2022-09-16 MED ORDER — OXYCODONE-ACETAMINOPHEN 10-325 MG PO TABS
1.0000 | ORAL_TABLET | ORAL | 0 refills | Status: AC | PRN
Start: 2022-09-16 — End: 2022-09-21

## 2022-09-16 MED ORDER — METHYLPREDNISOLONE SODIUM SUCC 125 MG IJ SOLR
125.0000 mg | Freq: Once | INTRAMUSCULAR | Status: AC
  Administered 2022-09-16: 125 mg via INTRAVENOUS
  Filled 2022-09-16: qty 2

## 2022-09-16 MED ORDER — PREDNISONE 50 MG PO TABS: 50.0000 mg | ORAL_TABLET | Freq: Every day | ORAL | 0 refills | Status: AC

## 2022-09-16 MED ORDER — LABETALOL HCL 5 MG/ML IV SOLN
10.0000 mg | Freq: Once | INTRAVENOUS | Status: AC
  Administered 2022-09-16: 10 mg via INTRAVENOUS
  Filled 2022-09-16: qty 4

## 2022-09-16 MED ORDER — HYDROMORPHONE HCL 1 MG/ML IJ SOLN
1.0000 mg | Freq: Once | INTRAMUSCULAR | Status: AC
  Administered 2022-09-16: 1 mg via INTRAVENOUS
  Filled 2022-09-16: qty 1

## 2022-09-16 NOTE — Assessment & Plan Note (Signed)
Stable

## 2022-09-16 NOTE — Assessment & Plan Note (Signed)
Patient has obviously failed colchicine.  He has been on it for 10 days without any significant improvement in his joint swelling or pain.  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  His synovial fluid is not consistent with a septic knee.  I do not think that IV antibiotics are indicated.  Discussed with the patient.  He would rather go home on outpatient therapy if possible.  Will give him 1 dose of IV Solu-Medrol and place him on a Medrol Dosepak at discharge.  Will need to get his pain under control first.  Will give him 1 dose of IV Dilaudid and send him home with oxycodone.  He states that he has taken this in the past.  He will need crutches.  Will reevaluate him in 2 hours after pain control and blood pressure control.

## 2022-09-16 NOTE — Assessment & Plan Note (Signed)
Likely due to pain. Will also give 1 dose of IV labetalol while also treating his acute pain.

## 2022-09-16 NOTE — Progress Notes (Signed)
   Pain much improved. Pt would like to go home. Will rX prednisone and percocet  Carollee Herter, DO [facility]

## 2022-09-16 NOTE — Assessment & Plan Note (Signed)
Stop colchicine. Likely the cause of his diarrhea. He states he has had diarrhea from colchicine in the past.

## 2022-09-16 NOTE — Assessment & Plan Note (Signed)
See above

## 2022-09-16 NOTE — Subjective & Objective (Signed)
CC: right wrist pain, left knee pain with swelling HPI: 57 year old white male with no significant medical history except for gout presents to the ER today with a 10-day history of swelling of the left knee, right wrist pain.  He has also had diarrhea for the last 4 days.  He has been taking colchicine twice a day for the last 10 days.  Noticed diarrhea starting about 4 days ago.  Now having nothing but watery diarrhea.  He has noticed mild improvement in his gout but not complete resolution like he normally does.  He states all this started about 10 days ago when he was working.  He works as a Engineer, site.  He states that he had a metal cylinder of Freon striking him in the knee.  He states that it hurt.  He stated about a day later he noticed swelling of his left knee.  He started taking colchicine.  He has noticed some mild improvement in the pain but the swelling in his knee continued.  He is fallen at least 2 or 3 times at home.  He is using crutches at home.  He has fallen on top of his right wrist which also has been hurting.  He denies any fever or chills.  On arrival to the ER, temp 98.6 heart rate 106 blood pressure 114/79.  White count 10.4, hemoglobin 14.4, platelets of 351 Sodium 135, potassium 3.3, chloride of 95, bicarb 26, BUN of 12, creatinine 1.0, glucose 103  Total protein 8.5, albumin 3.1, AST 68, ALT of 38, alk phos of 111, total bili 0.7 Lipase normal at 28  Lactic acid normal at 1.0  COVID-negative influenza negative RSV negative  Left knee x-ray showed moderate joint effusion.  No acute fractures.  Right wrist x-ray showed no fracture or dislocation.  EDP performed a left knee arthrocentesis. Showed 5190 white cells in the synovial fluid There were intracellular monosodium urate crystals.  [facility] consulted.

## 2022-09-16 NOTE — Consult Note (Signed)
Hospitalist consultation History and Physical    Dalton Kelley WUJ:[id number] DOB: 04-24-1965 DOA: 09/15/2022  DOS: the patient was seen and examined on 09/15/2022  PCP: Patient, No Pcp Per   Patient coming from: Home  I have personally briefly reviewed patient's old medical records in [facility]  CC: right wrist pain, left knee pain with swelling HPI: 57 year old white male with no significant medical history except for gout presents to the ER today with a 10-day history of swelling of the left knee, right wrist pain.  He has also had diarrhea for the last 4 days.  He has been taking colchicine twice a day for the last 10 days.  Noticed diarrhea starting about 4 days ago.  Now having nothing but watery diarrhea.  He has noticed mild improvement in his gout but not complete resolution like he normally does.  He states all this started about 10 days ago when he was working.  He works as a Engineer, site.  He states that he had a metal cylinder of Freon striking him in the knee.  He states that it hurt.  He stated about a day later he noticed swelling of his left knee.  He started taking colchicine.  He has noticed some mild improvement in the pain but the swelling in his knee continued.  He is fallen at least 2 or 3 times at home.  He is using crutches at home.  He has fallen on top of his right wrist which also has been hurting.  He denies any fever or chills.  On arrival to the ER, temp 98.6 heart rate 106 blood pressure 114/79.  White count 10.4, hemoglobin 14.4, platelets of 351 Sodium 135, potassium 3.3, chloride of 95, bicarb 26, BUN of 12, creatinine 1.0, glucose 103  Total protein 8.5, albumin 3.1, AST 68, ALT of 38, alk phos of 111, total bili 0.7 Lipase normal at 28  Lactic acid normal at 1.0  COVID-negative influenza negative RSV negative  Left knee x-ray showed moderate joint effusion.  No acute fractures.  Right wrist x-ray showed no fracture or  dislocation.  EDP performed a left knee arthrocentesis. Showed 5190 white cells in the synovial fluid There were intracellular monosodium urate crystals.  [facility] hospitalist consulted.     ED Course: WBC 10.4, BUN 12, Scr 1.0 Xray left knee and right wrist negative for fracture EDP performed arthrocentesis Synovial WBC 5190. +intracellular monosodium urate crystals  Review of Systems:  Review of Systems  Constitutional: Negative.   HENT: Negative.    Eyes: Negative.   Respiratory: Negative.    Cardiovascular: Negative.   Gastrointestinal:  Positive for diarrhea.  Genitourinary: Negative.   Musculoskeletal:        Right wrist and left knee pain  Skin: Negative.   Neurological: Negative.   Endo/Heme/Allergies: Negative.   Psychiatric/Behavioral: Negative.    All other systems reviewed and are negative.   History reviewed. No pertinent past medical history.  History reviewed. No pertinent surgical history.   reports that he has been smoking. He does not have any smokeless tobacco history on file. No history on file for alcohol use and drug use.  No Known Allergies  History reviewed. No pertinent family history.  Prior to Admission medications   Medication Sig Start Date End Date Taking? Authorizing Provider  Aspirin-Salicylamide-Caffeine (BC HEADACHE POWDER PO) Take 1 packet by mouth as needed (headache/pain).   Yes [provider]  bacitracin ointment Apply 1 application topically 2 (two) times  daily. Patient not taking: Reported on 03/25/2018 02/14/14   [name], MD  colchicine 0.6 MG tablet Take 1 tablet (0.6 mg total) by mouth 2 (two) times daily. Patient not taking: Reported on 09/16/2022 03/25/18   Lorre Nick, MD  doxycycline (VIBRAMYCIN) 100 MG capsule Take 1 capsule (100 mg total) by mouth 2 (two) times daily. Patient not taking: Reported on 09/16/2022 03/25/18   Lorre Nick, MD  HYDROcodone-acetaminophen (NORCO/VICODIN) 5-325 MG per tablet Take  1 tablet by mouth every 6 (six) hours as needed. Patient not taking: Reported on 03/25/2018 02/14/14   [name], MD  oxyCODONE-acetaminophen (PERCOCET/ROXICET) 5-325 MG tablet Take 1-2 tablets by mouth every 4 (four) hours as needed for severe pain. Patient not taking: Reported on 09/16/2022 03/25/18   Lorre Nick, MD    Physical Exam: Vitals:   09/15/22 1836 09/15/22 2143 09/15/22 2252 09/16/22 0220  BP: (!) 149/102 (!) 162/99  (!) 196/114  Pulse: 98 87  84  Resp: 12 14  16   Temp: (!) 97.5 F (36.4 C)  98.4 F (36.9 C)   TempSrc: Oral  Oral   SpO2: 99% 96%  98%    Physical Exam Vitals and nursing note reviewed.  Constitutional:      General: He is not in acute distress.    Appearance: He is obese. He is not toxic-appearing or diaphoretic.  HENT:     Head: Normocephalic and atraumatic.  Eyes:     General: No scleral icterus. Cardiovascular:     Rate and Rhythm: Normal rate and regular rhythm.  Pulmonary:     Effort: Pulmonary effort is normal.     Breath sounds: Normal breath sounds.  Abdominal:     General: Abdomen is protuberant. Bowel sounds are normal. There is no distension.     Palpations: Abdomen is soft.     Tenderness: There is no abdominal tenderness.  Musculoskeletal:     Comments: Mild left knee effusion  +right wrist effusion with tenderness to palpation.  Skin:    Capillary Refill: Capillary refill takes less than 2 seconds.  Neurological:     General: No focal deficit present.     Mental Status: He is alert and oriented to person, place, and time.      Labs on Admission: I have personally reviewed following labs and imaging studies  CBC: Recent Labs  Lab 09/15/22 1723  WBC 10.4  HGB 14.4  HCT 41.8  MCV 103.7*  PLT 351   Basic Metabolic Panel: Recent Labs  Lab 09/15/22 1723  NA 135  K 3.3*  CL 95*  CO2 26  GLUCOSE 103*  BUN 12  CREATININE 1.00  CALCIUM 8.7*   GFR: CrCl cannot be calculated (Unknown ideal weight.). Liver  Function Tests: Recent Labs  Lab 09/15/22 1723  AST 68*  ALT 38  ALKPHOS 111  BILITOT 0.7  PROT 8.5*  ALBUMIN 3.1*   Recent Labs  Lab 09/15/22 1723  LIPASE 28    Coagulation Profile: Recent Labs  Lab 09/15/22 2100  INR 0.9   Synovial cell count + diff, w/ crystals [[id number]] (Abnormal) Collected: 09/16/22 0022  Specimen: Synovial Fluid Updated: 09/16/22 0304   Color, Synovial STRAW Abnormal    Appearance-Synovial TURBID Abnormal    Crystals, Fluid INTRACELLULAR MONOSODIUM URATE CRYSTALS   WBC, Synovial 5,190 High  /cu mm    Neutrophil, Synovial 91 High  %    Lymphocytes-Synovial Fld 5 %    Monocyte-Macrophage-Synovial Fluid 4 Low  %  Eosinophils-Synovial 0 %    Radiological Exams on Admission: I have personally reviewed images DG Wrist Complete Right  Result Date: 09/15/2022 CLINICAL DATA:  Pain and swelling EXAM: RIGHT WRIST - COMPLETE 3+ VIEW COMPARISON:  None Available. FINDINGS: No acute fracture or dislocation. Remote posttraumatic deformity fifth metacarpal head. Degenerative changes at the radiocarpal joint. IMPRESSION: No acute fracture or dislocation. Electronically Signed   By: Minerva Fester M.D.   On: 09/15/2022 19:43   DG Knee Complete 4 Views Left  Result Date: 09/15/2022 CLINICAL DATA:  Pain and swelling. Dropped a 30 pound drum a Freon onto left knee. EXAM: LEFT KNEE - COMPLETE 4+ VIEW COMPARISON:  None Available. FINDINGS: Moderate joint effusion. Diffuse decreased bone mineralization. Joint spaces are preserved. No acute fracture is seen. No dislocation. Moderate atherosclerotic calcifications. IMPRESSION: Moderate joint effusion. No acute fracture is seen. Electronically Signed   By: Neita Garnet M.D.   On: 09/15/2022 18:33    EKG: My personal interpretation of EKG shows: no EKG  Assessment/Plan Principal Problem:   Acute gout of left knee Active Problems:   Acute gout of right wrist   Elevated BP without diagnosis of hypertension   Diarrhea  due to drug   Hypokalemia    Assessment and Plan: * Acute gout of left knee Patient has obviously failed colchicine.  He has been on it for 10 days without any significant improvement in his joint swelling or pain.  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  His synovial fluid is not consistent with a septic knee.  I do not think that IV antibiotics are indicated.  Discussed with the patient.  He would rather go home on outpatient therapy if possible.  Will give him 1 dose of IV Solu-Medrol and place him on a Medrol Dosepak at discharge.  Will need to get his pain under control first.  Will give him 1 dose of IV Dilaudid and send him home with oxycodone.  He states that he has taken this in the past.  He will need crutches.  Will reevaluate him in 2 hours after pain control and blood pressure control.  Acute gout of right wrist See above.  Hypokalemia Stable.  Diarrhea due to drug Stop colchicine. Likely the cause of his diarrhea. He states he has had diarrhea from colchicine in the past.  Elevated BP without diagnosis of hypertension Likely due to pain. Will also give 1 dose of IV labetalol while also treating his acute pain.   Disposition Plan: return home  Consults called: none  Admission status:  pt has elected to go home after adequate pain control ,  will give dose of IV solumedrol, IV dilaudid for gout. 1 dose of IV labetalol for elevated BP. Will re-assess in 2 hours.   Carollee Herter, DO [facility] 09/16/2022, 3:31 AM

## 2022-09-17 ENCOUNTER — Other Ambulatory Visit: Payer: Self-pay

## 2022-09-17 ENCOUNTER — Encounter (HOSPITAL_COMMUNITY): Payer: Self-pay

## 2022-09-17 ENCOUNTER — Observation Stay (HOSPITAL_COMMUNITY)
Admission: EM | Admit: 2022-09-17 | Discharge: 2022-09-18 | Disposition: A | Discharge status: Home / Self Care | Payer: 59 | Attending: Internal Medicine | Admitting: Internal Medicine

## 2022-09-17 DIAGNOSIS — R7881 Bacteremia: Principal | ICD-10-CM | POA: Diagnosis present

## 2022-09-17 DIAGNOSIS — M10062 Idiopathic gout, left knee: Secondary | ICD-10-CM | POA: Insufficient documentation

## 2022-09-17 DIAGNOSIS — F17200 Nicotine dependence, unspecified, uncomplicated: Secondary | ICD-10-CM | POA: Diagnosis not present

## 2022-09-17 DIAGNOSIS — M10031 Idiopathic gout, right wrist: Secondary | ICD-10-CM | POA: Insufficient documentation

## 2022-09-17 DIAGNOSIS — E876 Hypokalemia: Secondary | ICD-10-CM | POA: Diagnosis present

## 2022-09-17 DIAGNOSIS — Z789 Other specified health status: Secondary | ICD-10-CM

## 2022-09-17 DIAGNOSIS — I10 Essential (primary) hypertension: Secondary | ICD-10-CM | POA: Insufficient documentation

## 2022-09-17 DIAGNOSIS — R030 Elevated blood-pressure reading, without diagnosis of hypertension: Secondary | ICD-10-CM | POA: Diagnosis not present

## 2022-09-17 DIAGNOSIS — F1090 Alcohol use, unspecified, uncomplicated: Secondary | ICD-10-CM

## 2022-09-17 DIAGNOSIS — M109 Gout, unspecified: Secondary | ICD-10-CM | POA: Diagnosis present

## 2022-09-17 LAB — CBC WITH DIFFERENTIAL/PLATELET
Abs Immature Granulocytes: 0.12 10*3/uL — ABNORMAL HIGH (ref 0.00–0.07)
Basophils Absolute: 0 10*3/uL (ref 0.0–0.1)
Basophils Relative: 0 %
Eosinophils Absolute: 0 10*3/uL (ref 0.0–0.5)
Eosinophils Relative: 0 %
HCT: 38.9 % — ABNORMAL LOW (ref 39.0–52.0)
Hemoglobin: 13 g/dL (ref 13.0–17.0)
Immature Granulocytes: 1 %
Lymphocytes Relative: 3 %
Lymphs Abs: 0.4 10*3/uL — ABNORMAL LOW (ref 0.7–4.0)
MCH: 34.6 pg — ABNORMAL HIGH (ref 26.0–34.0)
MCHC: 33.4 g/dL (ref 30.0–36.0)
MCV: 103.5 fL — ABNORMAL HIGH (ref 80.0–100.0)
Monocytes Absolute: 0.6 10*3/uL (ref 0.1–1.0)
Monocytes Relative: 5 %
Neutro Abs: 10.6 10*3/uL — ABNORMAL HIGH (ref 1.7–7.7)
Neutrophils Relative %: 91 %
Platelets: 372 10*3/uL (ref 150–400)
RBC: 3.76 MIL/uL — ABNORMAL LOW (ref 4.22–5.81)
RDW: 15.1 % (ref 11.5–15.5)
WBC: 11.7 10*3/uL — ABNORMAL HIGH (ref 4.0–10.5)
nRBC: 0 % (ref 0.0–0.2)

## 2022-09-17 LAB — COMPREHENSIVE METABOLIC PANEL
ALT: 56 U/L — ABNORMAL HIGH (ref 0–44)
AST: 102 U/L — ABNORMAL HIGH (ref 15–41)
Albumin: 2.9 g/dL — ABNORMAL LOW (ref 3.5–5.0)
Alkaline Phosphatase: 98 U/L (ref 38–126)
Anion gap: 15 (ref 5–15)
BUN: 14 mg/dL (ref 6–20)
CO2: 21 mmol/L — ABNORMAL LOW (ref 22–32)
Calcium: 9.1 mg/dL (ref 8.9–10.3)
Chloride: 98 mmol/L (ref 98–111)
Creatinine, Ser: 0.91 mg/dL (ref 0.61–1.24)
GFR, Estimated: >60 mL/min (ref >60)
Glucose, Bld: 113 mg/dL — ABNORMAL HIGH (ref 70–99)
Potassium: 3 mmol/L — ABNORMAL LOW (ref 3.5–5.1)
Sodium: 134 mmol/L — ABNORMAL LOW (ref 135–145)
Total Bilirubin: 0.3 mg/dL (ref 0.3–1.2)
Total Protein: 7.6 g/dL (ref 6.5–8.1)

## 2022-09-17 LAB — MAGNESIUM: Magnesium: 2.1 mg/dL (ref 1.7–2.4)

## 2022-09-17 MED ORDER — LORAZEPAM 1 MG PO TABS: 1.0000 mg | ORAL_TABLET | ORAL | Status: DC | PRN

## 2022-09-17 MED ORDER — FOLIC ACID 1 MG PO TABS
1.0000 mg | ORAL_TABLET | Freq: Every day | ORAL | Status: DC
  Administered 2022-09-18: 1 mg via ORAL
  Filled 2022-09-17: qty 1

## 2022-09-17 MED ORDER — AMLODIPINE BESYLATE 5 MG PO TABS
5.0000 mg | ORAL_TABLET | Freq: Every day | ORAL | Status: DC
  Administered 2022-09-17 – 2022-09-18 (×2): 5 mg via ORAL
  Filled 2022-09-17 (×2): qty 1

## 2022-09-17 MED ORDER — NICOTINE 21 MG/24HR TD PT24
21.0000 mg | MEDICATED_PATCH | Freq: Every day | TRANSDERMAL | Status: DC
  Administered 2022-09-17 – 2022-09-18 (×2): 21 mg via TRANSDERMAL
  Filled 2022-09-17 (×2): qty 1

## 2022-09-17 MED ORDER — POTASSIUM CHLORIDE CRYS ER 20 MEQ PO TBCR
40.0000 meq | EXTENDED_RELEASE_TABLET | Freq: Once | ORAL | Status: AC
  Administered 2022-09-17: 40 meq via ORAL
  Filled 2022-09-17: qty 2

## 2022-09-17 MED ORDER — LORAZEPAM 2 MG/ML IJ SOLN: 1.0000 mg | INTRAMUSCULAR | Status: DC | PRN

## 2022-09-17 MED ORDER — LORAZEPAM 1 MG PO TABS
1.0000 mg | ORAL_TABLET | ORAL | Status: DC | PRN
  Administered 2022-09-17: 1 mg via ORAL
  Filled 2022-09-17: qty 1

## 2022-09-17 MED ORDER — THIAMINE MONONITRATE 100 MG PO TABS
100.0000 mg | ORAL_TABLET | Freq: Every day | ORAL | Status: DC
  Administered 2022-09-18: 100 mg via ORAL
  Filled 2022-09-17: qty 1

## 2022-09-17 MED ORDER — THIAMINE MONONITRATE 100 MG PO TABS
100.0000 mg | ORAL_TABLET | Freq: Every day | ORAL | Status: DC
  Administered 2022-09-17: 100 mg via ORAL
  Filled 2022-09-17: qty 1

## 2022-09-17 MED ORDER — PREDNISONE 50 MG PO TABS
60.0000 mg | ORAL_TABLET | Freq: Every day | ORAL | Status: DC
  Administered 2022-09-18: 60 mg via ORAL
  Filled 2022-09-17: qty 1

## 2022-09-17 MED ORDER — ACETAMINOPHEN 650 MG RE SUPP: 650.0000 mg | Freq: Four times a day (QID) | RECTAL | Status: DC | PRN

## 2022-09-17 MED ORDER — VANCOMYCIN HCL IN DEXTROSE 1-5 GM/200ML-% IV SOLN
1000.0000 mg | Freq: Two times a day (BID) | INTRAVENOUS | Status: DC
  Administered 2022-09-17 – 2022-09-18 (×3): 1000 mg via INTRAVENOUS
  Filled 2022-09-17 (×3): qty 200

## 2022-09-17 MED ORDER — THIAMINE HCL 100 MG/ML IJ SOLN: 100.0000 mg | Freq: Every day | INTRAMUSCULAR | Status: DC

## 2022-09-17 MED ORDER — MOMETASONE FURO-FORMOTEROL FUM 200-5 MCG/ACT IN AERO
2.0000 | INHALATION_SPRAY | Freq: Two times a day (BID) | RESPIRATORY_TRACT | Status: DC
  Administered 2022-09-17 – 2022-09-18 (×2): 2 via RESPIRATORY_TRACT
  Filled 2022-09-17 (×3): qty 8.8

## 2022-09-17 MED ORDER — LORAZEPAM 2 MG/ML IJ SOLN: 0.0000 mg | Freq: Two times a day (BID) | INTRAMUSCULAR | Status: DC

## 2022-09-17 MED ORDER — UMECLIDINIUM BROMIDE 62.5 MCG/ACT IN AEPB
1.0000 | INHALATION_SPRAY | Freq: Every day | RESPIRATORY_TRACT | Status: DC
  Administered 2022-09-18: 1 via RESPIRATORY_TRACT
  Filled 2022-09-17: qty 7

## 2022-09-17 MED ORDER — ADULT MULTIVITAMIN W/MINERALS CH
1.0000 | ORAL_TABLET | Freq: Every day | ORAL | Status: DC
  Administered 2022-09-18: 1 via ORAL
  Filled 2022-09-17: qty 1

## 2022-09-17 MED ORDER — LORAZEPAM 2 MG/ML IJ SOLN: 0.0000 mg | Freq: Four times a day (QID) | INTRAMUSCULAR | Status: DC

## 2022-09-17 MED ORDER — ONDANSETRON HCL 4 MG/2ML IJ SOLN: 4.0000 mg | Freq: Four times a day (QID) | INTRAMUSCULAR | Status: DC | PRN

## 2022-09-17 MED ORDER — ACETAMINOPHEN 325 MG PO TABS: 650.0000 mg | ORAL_TABLET | Freq: Four times a day (QID) | ORAL | Status: DC | PRN

## 2022-09-17 MED ORDER — ENOXAPARIN SODIUM 40 MG/0.4ML IJ SOSY
40.0000 mg | PREFILLED_SYRINGE | INTRAMUSCULAR | Status: DC
  Administered 2022-09-17: 40 mg via SUBCUTANEOUS
  Filled 2022-09-17: qty 0.4

## 2022-09-17 MED ORDER — FOLIC ACID 1 MG PO TABS
1.0000 mg | ORAL_TABLET | Freq: Every day | ORAL | Status: DC
  Administered 2022-09-17: 1 mg via ORAL
  Filled 2022-09-17: qty 1

## 2022-09-17 MED ORDER — ONDANSETRON HCL 4 MG PO TABS: 4.0000 mg | ORAL_TABLET | Freq: Four times a day (QID) | ORAL | Status: DC | PRN

## 2022-09-17 MED ORDER — POLYETHYLENE GLYCOL 3350 17 G PO PACK: 17.0000 g | PACK | Freq: Every day | ORAL | Status: DC | PRN

## 2022-09-17 MED ORDER — HYDRALAZINE HCL 20 MG/ML IJ SOLN
10.0000 mg | Freq: Four times a day (QID) | INTRAMUSCULAR | Status: DC | PRN
  Administered 2022-09-17 – 2022-09-18 (×2): 10 mg via INTRAVENOUS
  Filled 2022-09-17 (×2): qty 1

## 2022-09-17 MED ORDER — SORBITOL 70 % SOLN: 30.0000 mL | Freq: Every day | Status: DC | PRN

## 2022-09-17 MED ORDER — SODIUM CHLORIDE 0.9% FLUSH
3.0000 mL | Freq: Two times a day (BID) | INTRAVENOUS | Status: DC
  Administered 2022-09-17 – 2022-09-18 (×3): 3 mL via INTRAVENOUS

## 2022-09-17 MED ORDER — OXYCODONE HCL 5 MG PO TABS
10.0000 mg | ORAL_TABLET | ORAL | Status: DC | PRN
  Administered 2022-09-17 – 2022-09-18 (×5): 10 mg via ORAL
  Filled 2022-09-17 (×5): qty 2

## 2022-09-17 MED ORDER — ADULT MULTIVITAMIN W/MINERALS CH
1.0000 | ORAL_TABLET | Freq: Every day | ORAL | Status: DC
  Administered 2022-09-17: 1 via ORAL
  Filled 2022-09-17: qty 1

## 2022-09-17 MED ORDER — ALBUTEROL SULFATE (2.5 MG/3ML) 0.083% IN NEBU: 2.5000 mg | INHALATION_SOLUTION | RESPIRATORY_TRACT | Status: DC | PRN

## 2022-09-17 NOTE — ED Notes (Addendum)
Pt. Called x2 to come back to the ED for positive blood culture results. No answer from patient. Voicemail left to call department. Provider notified of positive test results.

## 2022-09-17 NOTE — ED Provider Notes (Signed)
[place] EMERGENCY DEPARTMENT AT [facility] Provider Note   CSN: [id number] Arrival date & time: 09/17/22  1152     History  Chief Complaint  Patient presents with   Abnormal Lab    Dalton Kelley is a 57 y.o. male who was called and told to return to the emergency department due to positive blood cultures.  Patient was seen in the emergency department on 09/15/2022 with complaint of right wrist and left knee pain.  Patient hit his knee with a refrigerant canister 12 days prior to being seen on the 31st developed a left knee effusion.  That effusion became very painful red and hot.  He had associated pain red nests and heat in the right wrist which he attributes to to a gout flare.  Patient states that he began taking colchicine as directed however it did not seem to be helping so began taking more.  4 days to being seen he developed severe diarrhea for about 4 days.  He has a past medical history of daily alcohol use.  He is a heavy smoker.  He had an associated petechial rash with swelling of the lower extremities bilaterally.  Patient was seen by hospitalist and at that time did not feel admission was necessary.  Patient was discharged with Percocet and prednisone.  He reports that his pain is significantly improved in both his left knee and wrist.  He has had reduction of the swelling in his feet and the rash that he had previously is now gone.  He has no more diarrhea.  He is not running fevers. Patient's blood cultures showed gram positive rods.  He has no other significant complaints at this time.  He does continue to have some redness and swelling in the right wrist.  Abnormal Lab      Home Medications Prior to Admission medications   Medication Sig Start Date End Date Taking? Authorizing Provider  oxyCODONE-acetaminophen (PERCOCET) 10-325 MG tablet Take 1 tablet by mouth every 4 (four) hours as needed for up to 5 days for pain. 09/16/22 09/21/22  Carollee Herter, DO  predniSONE  (DELTASONE) 50 MG tablet Take 1 tablet (50 mg total) by mouth daily with breakfast for 10 days. 09/16/22 09/26/22  Carollee Herter, DO      Allergies    Patient has no known allergies.    Review of Systems   Review of Systems  Physical Exam Updated Vital Signs BP (!) 146/103   Pulse 97   Temp 98 F (36.7 C) (Oral)   Resp 15   Ht 5\' 10"  (1.778 m)   Wt 86.2 kg   SpO2 99%   BMI 27.26 kg/m  Physical Exam Vitals and nursing note (smells stongly of tobacco) reviewed.  Constitutional:      General: He is not in acute distress.    Appearance: He is well-developed. He is not diaphoretic.  HENT:     Head: Normocephalic and atraumatic.  Eyes:     General: Scleral icterus present.     Extraocular Movements: Extraocular movements intact.     Conjunctiva/sclera: Conjunctivae normal.     Pupils: Pupils are equal, round, and reactive to light.  Cardiovascular:     Rate and Rhythm: Normal rate and regular rhythm.     Heart sounds: Normal heart sounds.  Pulmonary:     Effort: Pulmonary effort is normal. No respiratory distress.     Breath sounds: Normal breath sounds.  Abdominal:     Palpations: Abdomen is  soft.     Tenderness: There is no abdominal tenderness.  Musculoskeletal:     Cervical back: Normal range of motion and neck supple.     Comments: Redness and swelling noted at the right wrist over the distal ulna.  There is diffuse swelling of the hand.  He is able to move the rest.  Left knee with some continued effusion.  Patient is able to flex and extend the knee with limited range of motion due to stiffness.  He is ambulatory.  Skin:    General: Skin is warm and dry.  Neurological:     Mental Status: He is alert.  Psychiatric:        Behavior: Behavior normal.     ED Results / Procedures / Treatments   Labs (all labs ordered are listed, but only abnormal results are displayed) Labs Reviewed  COMPREHENSIVE METABOLIC PANEL  CBC WITH DIFFERENTIAL/PLATELET     EKG None  Radiology DG Wrist Complete Right  Result Date: 09/15/2022 CLINICAL DATA:  Pain and swelling EXAM: RIGHT WRIST - COMPLETE 3+ VIEW COMPARISON:  None Available. FINDINGS: No acute fracture or dislocation. Remote posttraumatic deformity fifth metacarpal head. Degenerative changes at the radiocarpal joint. IMPRESSION: No acute fracture or dislocation. Electronically Signed   By: Minerva Fester M.D.   On: 09/15/2022 19:43   DG Knee Complete 4 Views Left  Result Date: 09/15/2022 CLINICAL DATA:  Pain and swelling. Dropped a 30 pound drum a Freon onto left knee. EXAM: LEFT KNEE - COMPLETE 4+ VIEW COMPARISON:  None Available. FINDINGS: Moderate joint effusion. Diffuse decreased bone mineralization. Joint spaces are preserved. No acute fracture is seen. No dislocation. Moderate atherosclerotic calcifications. IMPRESSION: Moderate joint effusion. No acute fracture is seen. Electronically Signed   By: Neita Garnet M.D.   On: 09/15/2022 18:33    Procedures Procedures    Medications Ordered in ED Medications - No data to display  ED Course/ Medical Decision Making/ A&P Clinical Course as of 09/17/22 1404  Mon Sep 17, 2022  1329 WBC(!): 11.7 [AH]  1329 Comprehensive metabolic panel [AH]  1400 Case discussed with Dr. Drue Second who recommends ops admission and vancomycin for coverage. [AH]  1403 Comprehensive metabolic panel(!) [AH]  1403 Potassium(!): 3.0 [AH]  1403 Albumin(!): 2.9 [AH]  1403 ALT(!): 56 [AH]  1403 AST(!): 102 Case discussed with Dr. Janee Morn who will bring the patient in for observation for positive blood culture. [AH]    Clinical Course User Index [AH] Arthor Captain, PA-C                                 Medical Decision Making Amount and/or Complexity of Data Reviewed Labs: ordered. Decision-making details documented in ED Course.  Risk Decision regarding hospitalization.   Patient here with positive blood culture after being seen 2 days ago.  He is  feeling significantly better. Blood culture showed gram-positive rods.  Patient will be observation admitted.  I reviewed the patient's labs.  He has moderate hypokalemia with a potassium of 3.0.  Will replete orally.  Patient's AST ALT and albumin are consistent with chronic alcohol use and likely liver disease.  CBC with mildly elevated white blood cell count is slightly higher than it was just a few days ago.        Final Clinical Impression(s) / ED Diagnoses Final diagnoses:  None    Rx / DC Orders ED Discharge Orders  None         Arthor Captain, PA-C 09/20/22 1612    Lonell Grandchild, MD 09/21/22 [phone]

## 2022-09-17 NOTE — H&P (Addendum)
History and Physical    Claybourne Boring ZOX:[id number] DOB: 1965/07/02 DOA: 09/17/2022  PCP: Patient, No Pcp Per (Confirm with patient/family/NH records and if not entered, this has to be entered at [facility] point of entry) Patient coming from: Home  I have personally briefly reviewed patient's old medical records in [facility]  Chief Complaint: Called in due to abnormal blood cultures  HPI: Dalton Kelley is a 57 y.o. male with medical history significant of gout, tobacco use, history of alcohol use who was told to return to the ED due to positive blood cultures. Patient noted to have been seen in the ED on 09/15/2022 with complaints of right wrist and left knee pain.  Patient noted to have hit his knee on a refrigerant canister 12 days prior to being seen in the ED and developed a left knee effusion.  Effusion became very painful red and hot in both the left knee and the right wrist which he felt was secondary to a gout flare.  Patient noted to have been taking colchicine twice a day for 10 days prior to initial presentation on 09/15/2022.  Patient noted to have watery diarrhea with mild improvement with his gout but no complete resolution that he normally has and subsequently presented to the ED.  Patient seen in the ED 09/15/2022, underwent arthrocentesis.  EDP which showed 5190 white cells in the synovial fluid, as well as intracellular monosodium urate crystals.  Patient assessed by hospitalist on 09/15/2022 who discussed with patient and was felt that patient had an acute gout flare.  Patient's preference at that time was to go home on outpatient therapy if possible.  Patient received a dose of IV Solu-Medrol and placed on a Medrol Dosepak on discharge.  Patient did receive some IV Dilaudid which improved his pain and sent home on some oxycodone.  Blood cultures obtained during that visit.  Patient states since discharge his left knee and right wrist pain have improved significantly and had a reduction  in swelling in his feet and rash that he had had previously gone.  Patient denied any further diarrhea.  Patient denies any ongoing fevers, no chills, no chest pain, no shortness of breath, no abdominal pain, no melena, no constipation, no hematochezia, no hematemesis, no lightheadedness, no syncopal episodes.  ED Course: Patient seen in the ED, comprehensive metabolic profile with a sodium of 134, potassium of 3.0, bicarb of 21, glucose of 113, albumin of 2.9, AST of 102, ALT of 56 otherwise within normal limits.  CBC done with a white count of 11.7, hemoglobin of 13.0, platelet count of 372.  EDPA spoke with ID who recommended admission for observation pending finalization of blood cultures and recommended patient be started on IV vancomycin.  Review of Systems: As per HPI otherwise all other systems reviewed and are negative.  History reviewed. No pertinent past medical history.  History reviewed. No pertinent surgical history.  Social History  reports that he has been smoking. He does not have any smokeless tobacco history on file. He reports current alcohol use. He reports current drug use. Drug: Marijuana.  No Known Allergies  History reviewed. No pertinent family history. Mother deceased age 61 from brain cancer.  Father alive age 24 with  history of renal cell carcinoma status post resection.  Prior to Admission medications   Medication Sig Start Date End Date Taking? Authorizing Provider  oxyCODONE-acetaminophen (PERCOCET) 10-325 MG tablet Take 1 tablet by mouth every 4 (four) hours as needed for up to  5 days for pain. 09/16/22 09/21/22 Yes Carollee Herter, DO  predniSONE (DELTASONE) 50 MG tablet Take 1 tablet (50 mg total) by mouth daily with breakfast for 10 days. 09/16/22 09/26/22 Yes Carollee Herter, DO    Physical Exam: Vitals:   09/17/22 1202 09/17/22 1522 09/17/22 1543 09/17/22 1712  BP:  (!) 205/119 (!) 196/112 (!) 165/102  Pulse:  87  95  Resp:    20  Temp:  97.9 F (36.6 C)  98 F  (36.7 C)  TempSrc:      SpO2:  98%  96%  Weight: 86.2 kg     Height: 5\' 10"  (1.778 m)       Constitutional: NAD, calm, comfortable Vitals:   09/17/22 1202 09/17/22 1522 09/17/22 1543 09/17/22 1712  BP:  (!) 205/119 (!) 196/112 (!) 165/102  Pulse:  87  95  Resp:    20  Temp:  97.9 F (36.6 C)  98 F (36.7 C)  TempSrc:      SpO2:  98%  96%  Weight: 86.2 kg     Height: 5\' 10"  (1.778 m)      Eyes: PERRL, lids and conjunctivae normal ENMT: Mucous membranes are moist. Posterior pharynx clear of any exudate or lesions.Normal dentition.  Neck: normal, supple, no masses, no thyromegaly Respiratory: clear to auscultation bilaterally, no wheezing, no crackles. Normal respiratory effort. No accessory muscle use.  Cardiovascular: Regular rate and rhythm, no murmurs / rubs / gallops. No extremity edema. 2+ pedal pulses. No carotid bruits.  Abdomen: no tenderness, no masses palpated. No hepatosplenomegaly. Bowel sounds positive.  Musculoskeletal: Left knee with some warmth, some tenderness to palpation, decreased range of motion.  Right wrist with some erythema, some warmth, some tenderness to palpation.  No clubbing / cyanosis. No joint deformity upper and lower extremities. Normal muscle tone.  Skin: no rashes, lesions, ulcers. No induration Neurologic: CN 2-12 grossly intact. Sensation intact, DTR normal. Strength 5/5 in all 4.  Psychiatric: Normal judgment and insight. Alert and oriented x 3. Normal mood.    Labs on Admission: I have personally reviewed following labs and imaging studies  CBC: Recent Labs  Lab 09/15/22 1723 09/17/22 1249  WBC 10.4 11.7*  NEUTROABS  --  10.6*  HGB 14.4 13.0  HCT 41.8 38.9*  MCV 103.7* 103.5*  PLT 351 372    Basic Metabolic Panel: Recent Labs  Lab 09/15/22 1723 09/17/22 1249  NA 135 134*  K 3.3* 3.0*  CL 95* 98  CO2 26 21*  GLUCOSE 103* 113*  BUN 12 14  CREATININE 1.00 0.91  CALCIUM 8.7* 9.1  MG  --  2.1    GFR: Estimated  Creatinine Clearance: 90.2 mL/min (by C-G formula based on SCr of 0.91 mg/dL).  Liver Function Tests: Recent Labs  Lab 09/15/22 1723 09/17/22 1249  AST 68* 102*  ALT 38 56*  ALKPHOS 111 98  BILITOT 0.7 0.3  PROT 8.5* 7.6  ALBUMIN 3.1* 2.9*    Urine analysis:    Component Value Date/Time   COLORURINE YELLOW 09/16/2022 0230   APPEARANCEUR CLEAR 09/16/2022 0230   LABSPEC 1.015 09/16/2022 0230   PHURINE 6.0 09/16/2022 0230   GLUCOSEU NEGATIVE 09/16/2022 0230   HGBUR NEGATIVE 09/16/2022 0230   BILIRUBINUR NEGATIVE 09/16/2022 0230   KETONESUR NEGATIVE 09/16/2022 0230   PROTEINUR NEGATIVE 09/16/2022 0230   NITRITE NEGATIVE 09/16/2022 0230   LEUKOCYTESUR NEGATIVE 09/16/2022 0230    Radiological Exams on Admission: DG Wrist Complete Right  Result Date: 09/15/2022 CLINICAL  DATA:  Pain and swelling EXAM: RIGHT WRIST - COMPLETE 3+ VIEW COMPARISON:  None Available. FINDINGS: No acute fracture or dislocation. Remote posttraumatic deformity fifth metacarpal head. Degenerative changes at the radiocarpal joint. IMPRESSION: No acute fracture or dislocation. Electronically Signed   By: Minerva Fester M.D.   On: 09/15/2022 19:43   DG Knee Complete 4 Views Left  Result Date: 09/15/2022 CLINICAL DATA:  Pain and swelling. Dropped a 30 pound drum a Freon onto left knee. EXAM: LEFT KNEE - COMPLETE 4+ VIEW COMPARISON:  None Available. FINDINGS: Moderate joint effusion. Diffuse decreased bone mineralization. Joint spaces are preserved. No acute fracture is seen. No dislocation. Moderate atherosclerotic calcifications. IMPRESSION: Moderate joint effusion. No acute fracture is seen. Electronically Signed   By: Neita Garnet M.D.   On: 09/15/2022 18:33    EKG: Not done  Assessment/Plan Principal Problem:   Bacteremia Active Problems:   Acute gout of left knee   Acute gout of right wrist   Elevated BP without diagnosis of hypertension   Hypokalemia   Alcohol use   #1 concern for  bacteremia -Patient recently seen in the ED for an acute gout flare, blood cultures obtained at that time noted to be positive and as such patient called back to the ED to be admitted. -ED PA spoke with ID who recommended admission under observation and to start patient on vancomycin pending finalization of culture results. -Patient currently afebrile, denies any fever or chills, overall feels well. -CBC with a WBC of 11.7. -Continue IV vancomycin per pharmacy while awaiting finalization of culture results.  2.  Acute gout flare of the left knee and right wrist -Recently diagnosed and seen in the ED for an acute gouty flare of the left knee and the right wrist and placed on a Medrol Dosepak on discharge from the ED with clinical improvement. -Place patient on prednisone 60 mg daily. -Pain management. -Supportive care. -PT/OT.  3.  Hypokalemia -Magnesium level at 2.1. -K-Dur 40 mEq p.o. every 4 hours x 2 doses.  4.  Tobacco abuse -Tobacco cessation. -Nicotine patch. -Dulera, Incruse, albuterol nebs as needed.  5.  Elevated blood pressure without diagnosis of hypertension -Start Norvasc 5 mg daily. -IV hydralazine as needed.  6.  Alcohol use -Patient noted to be daily alcohol use. -Patient states drinks about 6-8 beers daily. -Place on Ativan withdrawal protocol. -Thiamine, folic acid, multivitamin.  3.  DVT prophylaxis: Lovenox Code Status:   Full Family Communication:  Updated patient.  No family at bedside. Disposition Plan:   Patient is from:  Home  Anticipated DC to:  Home  Anticipated DC date:  TBD  Anticipated DC barriers: Finalization of blood culture results  Consults called: None Admission status:  Placed in observation  Severity of Illness: The appropriate patient status for this patient is OBSERVATION. Observation status is judged to be reasonable and necessary in order to provide the required intensity of service to ensure the patient's safety. The patient's  presenting symptoms, physical exam findings, and initial radiographic and laboratory data in the context of their medical condition is felt to place them at decreased risk for further clinical deterioration. Furthermore, it is anticipated that the patient will be medically stable for discharge from the hospital within 2 midnights of admission.     Ramiro Harvest MD [facility]  How to contact the [facility] Attending or Consulting provider 7A - 7P or covering provider during after hours 7P -7A, for this patient?   Check the care team  in [facility] and look for a) attending/consulting TR[facility] provider listed and b) the [facility] team listed Log into [email or web address] and use [facility]'s universal password to access. If you do not have the password, please contact the hospital operator. Locate the [facility] provider you are looking for under [facility] and page to a number that you can be directly reached. If you still have difficulty reaching the provider, please page the [facility] (Director on Call) for the Hospitalists listed on amion for assistance.  09/17/2022, 6:04 PM

## 2022-09-17 NOTE — Plan of Care (Signed)

## 2022-09-17 NOTE — Progress Notes (Signed)
Pharmacy Antibiotic Note  Dalton Kelley is a 57 y.o. male admitted on 09/17/2022 with bacteremia.  Pharmacy has been consulted for vancomycin dosing.  Patient presented to the ED 8/31 with weakness/diarrhea x4 days. Blood cultures were collected with 1/4 bottles now growing gram(+) rods. Cultures should update tomorrow. Will start vancomycin therapy for now while awaiting finalized cultures. Patient given vancomycin 1000mg  IV x1 on 9/1 @0130 . Will avoid a bolus dose of vancomycin given recent administration.  Plan: - Start IV vancomycin 1000mg  IV q12hrs (eAUC 444 using Scr 1, IBW, and Vd 0.72) - Monitor cultures, renal function, and overall clinical picture - Vancomycin levels as needed  Height: 5\' 10"  (177.8 cm) Weight: 86.2 kg (190 lb) IBW/kg (Calculated) : 73  Temp (24hrs), Avg:98 F (36.7 C), Min:98 F (36.7 C), Max:98 F (36.7 C)  Recent Labs  Lab 09/15/22 1723 09/15/22 2112  WBC 10.4  --   CREATININE 1.00  --   LATICACIDVEN  --  1.0    Estimated Creatinine Clearance: 82.1 mL/min (by C-G formula based on SCr of 1 mg/dL).    No Known Allergies  Antimicrobials this admission: 9/1 vancomycin >>  9/1 ceftriaxone x1  Dose adjustments this admission: N/A  Microbiology results: 8/31 BCx: GPRs (1/4 bottles) 9/1 body fluid cultures: NG <24hrs    Thank you for allowing pharmacy to be a part of this patient's care.  Cherylin Mylar, PharmD Clinical Pharmacist  9/2/20241:22 PM

## 2022-09-17 NOTE — ED Notes (Signed)
ED TO INPATIENT HANDOFF REPORT  Name/Age/Gender Dalton Kelley 57 y.o. male  Code Status   Home/SNF/Other Home  Chief Complaint Bacteremia [R78.81]  Level of Care/Admitting Diagnosis ED Disposition     ED Disposition  Admit   Condition  --   Comment  Hospital Area: [facility] [[id number]]  Level of Care: Telemetry [5]  Admit to tele based on following criteria: Other see comments  Comments: hx of ETOH use with bacteremia possibly.  May place patient in observation at [facility] or [facility] if equivalent level of care is available:: No  Covid Evaluation: Asymptomatic - no recent exposure (last 10 days) testing not required  Diagnosis: Bacteremia [790.7.ICD-9-CM]  Admitting Physician: Rodolph Bong [[id number]]  Attending Physician: Rodolph Bong [[id number]]          Medical History History reviewed. No pertinent past medical history.  Allergies No Known Allergies  IV Location/Drains/Wounds Patient Lines/Drains/Airways Status     Active Line/Drains/Airways     Name Placement date Placement time Site Days   Peripheral IV 09/17/22 20 G 1.25" Left Antecubital 09/17/22  1252  Antecubital  less than 1            Labs/Imaging Results for orders placed or performed during the hospital encounter of 09/17/22 (from the past 48 hour(s))  Comprehensive metabolic panel     Status: Abnormal   Collection Time: 09/17/22 12:49 PM  Result Value Ref Range   Sodium 134 (L) 135 - 145 mmol/L   Potassium 3.0 (L) 3.5 - 5.1 mmol/L   Chloride 98 98 - 111 mmol/L   CO2 21 (L) 22 - 32 mmol/L   Glucose, Bld 113 (H) 70 - 99 mg/dL    Comment: Glucose reference range applies only to samples taken after fasting for at least 8 hours.   BUN 14 6 - 20 mg/dL   Creatinine, Ser 9.14 0.61 - 1.24 mg/dL   Calcium 9.1 8.9 - 78.2 mg/dL   Total Protein 7.6 6.5 - 8.1 g/dL   Albumin 2.9 (L) 3.5 - 5.0 g/dL   AST 956 (H) 15 - 41 U/L   ALT 56 (H) 0 - 44 U/L   Alkaline  Phosphatase 98 38 - 126 U/L   Total Bilirubin 0.3 0.3 - 1.2 mg/dL   GFR, Estimated >21 >30 mL/min    Comment: (NOTE) Calculated using the CKD-EPI Creatinine Equation (2021)    Anion gap 15 5 - 15    Comment: Performed at [facility], [address], [place], [place] [address]  CBC with Differential     Status: Abnormal   Collection Time: 09/17/22 12:49 PM  Result Value Ref Range   WBC 11.7 (H) 4.0 - 10.5 K/uL   RBC 3.76 (L) 4.22 - 5.81 MIL/uL   Hemoglobin 13.0 13.0 - 17.0 g/dL   HCT 46.9 (L) 62.9 - 52.8 %   MCV 103.5 (H) 80.0 - 100.0 fL   MCH 34.6 (H) 26.0 - 34.0 pg   MCHC 33.4 30.0 - 36.0 g/dL   RDW 41.3 24.4 - 01.0 %   Platelets 372 150 - 400 K/uL   nRBC 0.0 0.0 - 0.2 %   Neutrophils Relative % 91 %   Neutro Abs 10.6 (H) 1.7 - 7.7 K/uL   Lymphocytes Relative 3 %   Lymphs Abs 0.4 (L) 0.7 - 4.0 K/uL   Monocytes Relative 5 %   Monocytes Absolute 0.6 0.1 - 1.0 K/uL   Eosinophils Relative 0 %  Eosinophils Absolute 0.0 0.0 - 0.5 K/uL   Basophils Relative 0 %   Basophils Absolute 0.0 0.0 - 0.1 K/uL   Immature Granulocytes 1 %   Abs Immature Granulocytes 0.12 (H) 0.00 - 0.07 K/uL    Comment: Performed at [facility], [address] [place], [place] [address]   DG Wrist Complete Right  Result Date: 09/15/2022 CLINICAL DATA:  Pain and swelling EXAM: RIGHT WRIST - COMPLETE 3+ VIEW COMPARISON:  None Available. FINDINGS: No acute fracture or dislocation. Remote posttraumatic deformity fifth metacarpal head. Degenerative changes at the radiocarpal joint. IMPRESSION: No acute fracture or dislocation. Electronically Signed   By: Minerva Fester M.D.   On: 09/15/2022 19:43   DG Knee Complete 4 Views Left  Result Date: 09/15/2022 CLINICAL DATA:  Pain and swelling. Dropped a 30 pound drum a Freon onto left knee. EXAM: LEFT KNEE - COMPLETE 4+ VIEW COMPARISON:  None Available. FINDINGS: Moderate joint effusion. Diffuse decreased bone mineralization.  Joint spaces are preserved. No acute fracture is seen. No dislocation. Moderate atherosclerotic calcifications. IMPRESSION: Moderate joint effusion. No acute fracture is seen. Electronically Signed   By: Neita Garnet M.D.   On: 09/15/2022 18:33    Pending Labs Unresulted Labs (From admission, onward)     Start     Ordered   09/17/22 1436  Magnesium  Add-on,   AD        09/17/22 1435            Vitals/Pain Today's Vitals   09/17/22 1201 09/17/22 1202  BP: (!) 146/103   Pulse: 97   Resp: 15   Temp: 98 F (36.7 C)   TempSrc: Oral   SpO2: 99%   Weight:  86.2 kg  Height:  5\' 10"  (1.778 m)  PainSc:  0-No pain    Isolation Precautions No active isolations  Medications Medications  oxyCODONE (Oxy IR/ROXICODONE) immediate release tablet 10 mg (10 mg Oral Given 09/17/22 1254)  vancomycin (VANCOCIN) IVPB 1000 mg/200 mL premix (1,000 mg Intravenous New Bag/Given 09/17/22 1402)  nicotine (NICODERM CQ - dosed in mg/24 hours) patch 21 mg (21 mg Transdermal Patch Applied 09/17/22 1407)  potassium chloride SA (KLOR-CON M) CR tablet 40 mEq (has no administration in time range)  potassium chloride SA (KLOR-CON M) CR tablet 40 mEq (has no administration in time range)    Mobility walks with device

## 2022-09-17 NOTE — Progress Notes (Signed)
   09/17/22 1522  Vitals  Temp 97.9 F (36.6 C)  BP (!) 205/119  MAP (mmHg) 143  BP Location Right Arm  BP Method Automatic  Patient Position (if appropriate) Lying  Pulse Rate 87  Pulse Rate Source Monitor  MEWS COLOR  MEWS Score Color Yellow  Oxygen Therapy  SpO2 98 %  O2 Device Room Air  MEWS Score  MEWS Temp 0  MEWS Systolic 2  MEWS Pulse 0  MEWS RR 0  MEWS LOC 0  MEWS Score 2  Provider Notification  Provider Name/Title Rodolph Bong, MD  Date Provider Notified 09/17/22  Time Provider Notified 1545  Method of Notification Page  Notification Reason Other (Comment) (Yellow MEWS)  Provider response Other (Comment) (Hydralazine given)  Date of Provider Response 09/17/22  Time of Provider Response 1554

## 2022-09-17 NOTE — ED Triage Notes (Signed)
Patient stated he got a call for positive blood cultures and was told to come back to ED. Denies pain at this time.

## 2022-09-18 ENCOUNTER — Other Ambulatory Visit (HOSPITAL_COMMUNITY): Payer: Self-pay

## 2022-09-18 DIAGNOSIS — R7881 Bacteremia: Secondary | ICD-10-CM | POA: Diagnosis not present

## 2022-09-18 DIAGNOSIS — M10062 Idiopathic gout, left knee: Secondary | ICD-10-CM | POA: Diagnosis not present

## 2022-09-18 DIAGNOSIS — R030 Elevated blood-pressure reading, without diagnosis of hypertension: Secondary | ICD-10-CM | POA: Diagnosis not present

## 2022-09-18 DIAGNOSIS — E876 Hypokalemia: Secondary | ICD-10-CM | POA: Diagnosis not present

## 2022-09-18 LAB — COMPREHENSIVE METABOLIC PANEL
ALT: 61 U/L — ABNORMAL HIGH (ref 0–44)
AST: 86 U/L — ABNORMAL HIGH (ref 15–41)
Albumin: 2.7 g/dL — ABNORMAL LOW (ref 3.5–5.0)
Alkaline Phosphatase: 94 U/L (ref 38–126)
Anion gap: 8 (ref 5–15)
BUN: 14 mg/dL (ref 6–20)
CO2: 25 mmol/L (ref 22–32)
Calcium: 8.4 mg/dL — ABNORMAL LOW (ref 8.9–10.3)
Chloride: 100 mmol/L (ref 98–111)
Creatinine, Ser: 0.79 mg/dL (ref 0.61–1.24)
GFR, Estimated: >60 mL/min (ref >60)
Glucose, Bld: 95 mg/dL (ref 70–99)
Potassium: 3 mmol/L — ABNORMAL LOW (ref 3.5–5.1)
Sodium: 133 mmol/L — ABNORMAL LOW (ref 135–145)
Total Bilirubin: 0.5 mg/dL (ref 0.3–1.2)
Total Protein: 6.7 g/dL (ref 6.5–8.1)

## 2022-09-18 LAB — CBC
HCT: 37.2 % — ABNORMAL LOW (ref 39.0–52.0)
Hemoglobin: 12.7 g/dL — ABNORMAL LOW (ref 13.0–17.0)
MCH: 35.3 pg — ABNORMAL HIGH (ref 26.0–34.0)
MCHC: 34.1 g/dL (ref 30.0–36.0)
MCV: 103.3 fL — ABNORMAL HIGH (ref 80.0–100.0)
Platelets: 390 10*3/uL (ref 150–400)
RBC: 3.6 MIL/uL — ABNORMAL LOW (ref 4.22–5.81)
RDW: 15.6 % — ABNORMAL HIGH (ref 11.5–15.5)
WBC: 8.2 10*3/uL (ref 4.0–10.5)
nRBC: 0 % (ref 0.0–0.2)

## 2022-09-18 MED ORDER — UMECLIDINIUM BROMIDE 62.5 MCG/ACT IN AEPB: 1.0000 | INHALATION_SPRAY | Freq: Every day | RESPIRATORY_TRACT | Status: AC

## 2022-09-18 MED ORDER — NICOTINE 21 MG/24HR TD PT24
21.0000 mg | MEDICATED_PATCH | Freq: Every day | TRANSDERMAL | 1 refills | Status: AC
  Filled 2022-09-18: qty 28, 28d supply, fill #0

## 2022-09-18 MED ORDER — AMLODIPINE BESYLATE 10 MG PO TABS
10.0000 mg | ORAL_TABLET | Freq: Every day | ORAL | 1 refills | Status: AC
  Filled 2022-09-18: qty 30, 30d supply, fill #0

## 2022-09-18 MED ORDER — ADULT MULTIVITAMIN W/MINERALS CH: 1.0000 | ORAL_TABLET | Freq: Every day | ORAL | Status: AC

## 2022-09-18 MED ORDER — POTASSIUM CHLORIDE CRYS ER 10 MEQ PO TBCR
40.0000 meq | EXTENDED_RELEASE_TABLET | ORAL | Status: AC
  Administered 2022-09-18 (×2): 40 meq via ORAL
  Filled 2022-09-18 (×2): qty 4

## 2022-09-18 MED ORDER — MOMETASONE FURO-FORMOTEROL FUM 200-5 MCG/ACT IN AERO: 2.0000 | INHALATION_SPRAY | Freq: Two times a day (BID) | RESPIRATORY_TRACT | Status: AC

## 2022-09-18 MED ORDER — FOLIC ACID 1 MG PO TABS
1.0000 mg | ORAL_TABLET | Freq: Every day | ORAL | 1 refills | Status: AC
  Filled 2022-09-18: qty 30, 30d supply, fill #0

## 2022-09-18 MED ORDER — IPRATROPIUM-ALBUTEROL 20-100 MCG/ACT IN AERS
INHALATION_SPRAY | RESPIRATORY_TRACT | 1 refills | Status: DC
  Filled 2022-09-18: qty 1, 25d supply, fill #0

## 2022-09-18 MED ORDER — SODIUM CHLORIDE 0.9 % IV SOLN: INTRAVENOUS | Status: DC | PRN

## 2022-09-18 MED ORDER — VITAMIN B-1 100 MG PO TABS
100.0000 mg | ORAL_TABLET | Freq: Every day | ORAL | 1 refills | Status: AC
  Filled 2022-09-18: qty 30, 30d supply, fill #0

## 2022-09-18 NOTE — Discharge Summary (Addendum)
Physician Discharge Summary  Dois Billing WUJ:[id number] DOB: 06/12/1965 DOA: 09/17/2022  PCP: Patient, No Pcp Per  Admit date: 09/17/2022 Discharge date: 09/18/2022  Time spent: 60 minutes  Recommendations for Outpatient Follow-up:  Follow-up with PCP in 2 weeks.  On follow-up patient's gout will need to be reassessed.  Patient will need basic metabolic profile done to follow-up on electrolytes and renal function.  Patient blood pressure also need to be reassessed on follow-up as patient started on Norvasc during this hospitalization.   Discharge Diagnoses:  Principal Problem:   Bacteremia Active Problems:   Acute gout of left knee   Acute gout of right wrist   Elevated BP without diagnosis of hypertension   Hypokalemia   Alcohol use   Discharge Condition: Stable and improved  Diet recommendation: Regular  Filed Weights   09/17/22 1202  Weight: 86.2 kg    History of present illness:  Kort Runck is a 57 y.o. male with medical history significant of gout, tobacco use, history of alcohol use who was told to return to the ED due to positive blood cultures. Patient noted to have been seen in the ED on 09/15/2022 with complaints of right wrist and left knee pain.  Patient noted to have hit his knee on a refrigerant canister 12 days prior to being seen in the ED and developed a left knee effusion.  Effusion became very painful red and hot in both the left knee and the right wrist which he felt was secondary to a gout flare.  Patient noted to have been taking colchicine twice a day for 10 days prior to initial presentation on 09/15/2022.  Patient noted to have watery diarrhea with mild improvement with his gout but no complete resolution that he normally has and subsequently presented to the ED.  Patient seen in the ED 09/15/2022, underwent arthrocentesis.  EDP which showed 5190 white cells in the synovial fluid, as well as intracellular monosodium urate crystals.  Patient assessed by  hospitalist on 09/15/2022 who discussed with patient and was felt that patient had an acute gout flare.  Patient's preference at that time was to go home on outpatient therapy if possible.  Patient received a dose of IV Solu-Medrol and placed on a Medrol Dosepak on discharge.  Patient did receive some IV Dilaudid which improved his pain and sent home on some oxycodone.  Blood cultures obtained during that visit.  Patient states since discharge his left knee and right wrist pain have improved significantly and had a reduction in swelling in his feet and rash that he had had previously gone.  Patient denied any further diarrhea.  Patient denies any ongoing fevers, no chills, no chest pain, no shortness of breath, no abdominal pain, no melena, no constipation, no hematochezia, no hematemesis, no lightheadedness, no syncopal episodes.   ED Course: Patient seen in the ED, comprehensive metabolic profile with a sodium of 134, potassium of 3.0, bicarb of 21, glucose of 113, albumin of 2.9, AST of 102, ALT of 56 otherwise within normal limits.  CBC done with a white count of 11.7, hemoglobin of 13.0, platelet count of 372.  EDPA spoke with ID who recommended admission for observation pending finalization of blood cultures and recommended patient be started on IV vancomycin.  Hospital Course:  #1 concern for bacteremia -Patient recently seen in the ED for an acute gout flare, blood cultures obtained at that time noted to be positive with 1/4 bottles positive for gram-positive rods and as such patient called  back to the ED to be admitted. -ED PA spoke with ID who recommended admission under observation and to start patient on vancomycin pending finalization of culture results. -Patient remained afebrile, denied any fevers or chills, felt well.   -Patient noted on admission to have a leukocytosis with a white count of 11.7 however patient on steroids prior to presentation.   -Patient improved clinically, case  discussed again with ID and as patient was nontoxic, remained afebrile and clinically improved it was felt likely contaminant and patient was okay for discharge home.  - ID will follow-up on finalization of culture results.   -Patient will be discharged in stable and improved condition.    2.  Acute gout flare of the left knee and right wrist -Recently diagnosed and seen in the ED for an acute gouty flare of the left knee and the right wrist and placed on a Medrol Dosepak on discharge from the ED with clinical improvement. -Patient placed on prednisone 60 mg daily during the hospitalization will be discharged back on home regimen of Medrol dose pack to complete.   -Patient assessed by PT/OT.   -Outpatient follow-up.     3.  Hypokalemia -Repleted during the hospitalization.   -Outpatient follow-up.    4.  Tobacco abuse -Tobacco cessation stressed to patient. -Patient placed on a nicotine patch. -Also placed on Dulera, Incruse, albuterol nebs as needed. -Patient will be discharged home on Dulera and Incruse as patient noted to have minimal expiratory wheezing on exam.. -Outpatient follow-up.   5.  Elevated blood pressure without diagnosis of hypertension -Patient started on Norvasc 5 mg daily and uptitrated to 10 mg daily on discharge.   -Will need outpatient follow-up with a PCP for further management.    6.  Alcohol use -Patient noted to be daily alcohol use. -Patient stated drinks about 6-8 beers daily. -Patient was placed on Ativan withdrawal protocol, thiamine, folic acid, multivitamin.   -Patient had no episodes of withdrawal during the hospitalization.   -Outpatient follow-up.      Procedures: None  Consultations: Curb sided ID.  Discharge Exam: Vitals:   09/18/22 0956 09/18/22 1404  BP: 132/89 (!) 165/111  Pulse: 94 90  Resp: 18 18  Temp: 98.1 F (36.7 C) 97.8 F (36.6 C)  SpO2: 99% 97%    General: NAD Cardiovascular: RRR no murmurs rubs or gallops.  No JVD.   No lower extremity edema Respiratory: CTAB.  No wheezes, no crackles, no rhonchi.  Fair air movement.  Speaking in full sentences.  Discharge Instructions   Discharge Instructions     Diet general   Complete by: As directed    Increase activity slowly   Complete by: As directed       Allergies as of 09/18/2022   No Known Allergies      Medication List     TAKE these medications    amLODipine 10 MG tablet Commonly known as: NORVASC Take 1 tablet (10 mg total) by mouth daily. Start taking on: September 19, 2022   folic acid 1 MG tablet Commonly known as: FOLVITE Take 1 tablet (1 mg total) by mouth daily. Start taking on: September 19, 2022   mometasone-formoterol 200-5 MCG/ACT Aero Commonly known as: DULERA Inhale 2 puffs into the lungs 2 (two) times daily.   multivitamin with minerals Tabs tablet Take 1 tablet by mouth daily. Start taking on: September 19, 2022   nicotine 21 mg/24hr patch Commonly known as: NICODERM CQ - dosed in mg/24 hours  Place 1 patch (21 mg total) onto the skin daily. Start taking on: September 19, 2022   oxyCODONE-acetaminophen 10-325 MG tablet Commonly known as: Percocet Take 1 tablet by mouth every 4 (four) hours as needed for up to 5 days for pain.   predniSONE 50 MG tablet Commonly known as: DELTASONE Take 1 tablet (50 mg total) by mouth daily with breakfast for 10 days.   thiamine 100 MG tablet Commonly known as: Vitamin B-1 Take 1 tablet (100 mg total) by mouth daily. Start taking on: September 19, 2022   umeclidinium bromide 62.5 MCG/ACT Aepb Commonly known as: INCRUSE ELLIPTA Inhale 1 puff into the lungs daily. Start taking on: September 19, 2022       No Known Allergies  Follow-up Information     PCP Follow up.   Why: Follow-up in 2 weeks                 The results of significant diagnostics from this hospitalization (including imaging, microbiology, ancillary and laboratory) are listed below for reference.     Significant Diagnostic Studies: DG Wrist Complete Right  Result Date: 09/15/2022 CLINICAL DATA:  Pain and swelling EXAM: RIGHT WRIST - COMPLETE 3+ VIEW COMPARISON:  None Available. FINDINGS: No acute fracture or dislocation. Remote posttraumatic deformity fifth metacarpal head. Degenerative changes at the radiocarpal joint. IMPRESSION: No acute fracture or dislocation. Electronically Signed   By: Minerva Fester M.D.   On: 09/15/2022 19:43   DG Knee Complete 4 Views Left  Result Date: 09/15/2022 CLINICAL DATA:  Pain and swelling. Dropped a 30 pound drum a Freon onto left knee. EXAM: LEFT KNEE - COMPLETE 4+ VIEW COMPARISON:  None Available. FINDINGS: Moderate joint effusion. Diffuse decreased bone mineralization. Joint spaces are preserved. No acute fracture is seen. No dislocation. Moderate atherosclerotic calcifications. IMPRESSION: Moderate joint effusion. No acute fracture is seen. Electronically Signed   By: Neita Garnet M.D.   On: 09/15/2022 18:33    Microbiology: Recent Results (from the past 240 hour(s))  Culture, blood (routine x 2)     Status: None (Preliminary result)   Collection Time: 09/15/22  8:39 PM   Specimen: BLOOD LEFT HAND  Result Value Ref Range Status   Specimen Description   Final    BLOOD LEFT HAND Performed at [facility], [address] [place], [place] [address]    Special Requests   Final    BOTTLES DRAWN AEROBIC AND ANAEROBIC Blood Culture adequate volume Performed at [facility], [address], [place], [place] [address]    Culture   Final    NO GROWTH 2 DAYS Performed at [facility], [address], [place], [place] [address]    Report Status PENDING  Incomplete  Culture, blood (routine x 2)     Status: None (Preliminary result)   Collection Time: 09/15/22  8:44 PM   Specimen: Left Antecubital; Blood  Result Value Ref Range Status   Specimen Description   Final    LEFT ANTECUBITAL Performed at [facility], [address] [place], [place] [address]    Special Requests   Final    BOTTLES DRAWN AEROBIC AND ANAEROBIC Blood Culture adequate volume Performed at [facility], [address], [place], [place] [address]    Culture  Setup Time   Final    GRAM POSITIVE RODS ANAEROBIC BOTTLE ONLY CRITICAL RESULT CALLED TO, READ BACK BY AND VERIFIED WITH: GRAY,RN@0309  09/17/22 MK  Culture   Final    CULTURE REINCUBATED FOR BETTER GROWTH Performed at [facility], [address], [place], [place] [address]    Report Status PENDING  Incomplete  Resp panel by RT-PCR (RSV, Flu A&B, Covid) Anterior Nasal Swab     Status: None   Collection Time: 09/15/22  8:45 PM   Specimen: Anterior Nasal Swab  Result Value Ref Range Status   SARS Coronavirus 2 by RT PCR NEGATIVE NEGATIVE Final    Comment: (NOTE) SARS-CoV-2 target nucleic acids are NOT DETECTED.  The SARS-CoV-2 RNA is generally detectable in upper respiratory specimens during the acute phase of infection. The lowest concentration of SARS-CoV-2 viral copies this assay can detect is 138 copies/mL. A negative result does not preclude SARS-Cov-2 infection and should not be used as the sole basis for treatment or other patient management decisions. A negative result may occur with  improper specimen collection/handling, submission of specimen other than nasopharyngeal swab, presence of viral mutation(s) within the areas targeted by this assay, and inadequate number of viral copies(<138 copies/mL). A negative result must be combined with clinical observations, patient history, and epidemiological information. The expected result is Negative.  Fact Sheet for Patients:  BloggerCourse.com  Fact Sheet for Healthcare Providers:  SeriousBroker.it  This test is no t yet approved or cleared by the [place] FDA and  has been authorized for detection  and/or diagnosis of SARS-CoV-2 by FDA under an Emergency Use Authorization (EUA). This EUA will remain  in effect (meaning this test can be used) for the duration of the COVID-19 declaration under Section 564(b)(1) of the Act, 21 U.S.C.section 360bbb-3(b)(1), unless the authorization is terminated  or revoked sooner.       Influenza A by PCR NEGATIVE NEGATIVE Final   Influenza B by PCR NEGATIVE NEGATIVE Final    Comment: (NOTE) The Xpert Xpress SARS-CoV-2/FLU/RSV plus assay is intended as an aid in the diagnosis of influenza from Nasopharyngeal swab specimens and should not be used as a sole basis for treatment. Nasal washings and aspirates are unacceptable for Xpert Xpress SARS-CoV-2/FLU/RSV testing.  Fact Sheet for Patients: BloggerCourse.com  Fact Sheet for Healthcare Providers: SeriousBroker.it  This test is not yet approved or cleared by the [place] FDA and has been authorized for detection and/or diagnosis of SARS-CoV-2 by FDA under an Emergency Use Authorization (EUA). This EUA will remain in effect (meaning this test can be used) for the duration of the COVID-19 declaration under Section 564(b)(1) of the Act, 21 U.S.C. section 360bbb-3(b)(1), unless the authorization is terminated or revoked.     Resp Syncytial Virus by PCR NEGATIVE NEGATIVE Final    Comment: (NOTE) Fact Sheet for Patients: BloggerCourse.com  Fact Sheet for Healthcare Providers: SeriousBroker.it  This test is not yet approved or cleared by the [place] FDA and has been authorized for detection and/or diagnosis of SARS-CoV-2 by FDA under an Emergency Use Authorization (EUA). This EUA will remain in effect (meaning this test can be used) for the duration of the COVID-19 declaration under Section 564(b)(1) of the Act, 21 U.S.C. section 360bbb-3(b)(1), unless the authorization is terminated  or revoked.  Performed at [facility], [address], [place], [place] [address]   Body fluid culture w Gram Stain     Status: None (Preliminary result)   Collection Time: 09/16/22 12:22 AM   Specimen: Body Fluid  Result Value Ref Range Status   Specimen Description   Final    NONE Performed at  [facility], [address] [place], [place] [address]    Special Requests   Final    NONE FLUID SYNOVIAL Performed at [facility], [address] [place], [place] [address]    Gram Stain   Final    NO ORGANISMS SEEN WBC PRESENT, PREDOMINANTLY PMN RESULT CALLED TO, READ BACK BY AND VERIFIED WITH: Clois Dupes RN @ [phone] 09/16/22. GILBERTL Performed at [facility], [address], [place], [place] [address]    Culture   Final    NO GROWTH 2 DAYS Performed at [facility], [address] [place], [place] [address]    Report Status PENDING  Incomplete     Labs: Basic Metabolic Panel: Recent Labs  Lab 09/15/22 1723 09/17/22 1249 09/18/22 0512  NA 135 134* 133*  K 3.3* 3.0* 3.0*  CL 95* 98 100  CO2 26 21* 25  GLUCOSE 103* 113* 95  BUN 12 14 14   CREATININE 1.00 0.91 0.79  CALCIUM 8.7* 9.1 8.4*  MG  --  2.1  --    Liver Function Tests: Recent Labs  Lab 09/15/22 1723 09/17/22 1249 09/18/22 0512  AST 68* 102* 86*  ALT 38 56* 61*  ALKPHOS 111 98 94  BILITOT 0.7 0.3 0.5  PROT 8.5* 7.6 6.7  ALBUMIN 3.1* 2.9* 2.7*   Recent Labs  Lab 09/15/22 1723  LIPASE 28   No results for input(s): "AMMONIA" in the last 168 hours. CBC: Recent Labs  Lab 09/15/22 1723 09/17/22 1249 09/18/22 0512  WBC 10.4 11.7* 8.2  NEUTROABS  --  10.6*  --   HGB 14.4 13.0 12.7*  HCT 41.8 38.9* 37.2*  MCV 103.7* 103.5* 103.3*  PLT 351 372 390   Cardiac Enzymes: No results for input(s): "CKTOTAL", "CKMB", "CKMBINDEX", "TROPONINI" in the last 168 hours. BNP: BNP (last 3 results) No results for input(s): "BNP" in  the last 8760 hours.  ProBNP (last 3 results) No results for input(s): "PROBNP" in the last 8760 hours.  CBG: No results for input(s): "GLUCAP" in the last 168 hours.     Signed:  Ramiro Harvest MD.  [facility] 09/18/2022, 4:25 PM

## 2022-09-18 NOTE — Plan of Care (Signed)
Alert and oriented. Able to tolerate activity with knee. Medicated for pain with PRN medication. Discharging home today.   Problem: Education: Goal: Knowledge of General Education information will improve Description: Including pain rating scale, medication(s)/side effects and non-pharmacologic comfort measures Outcome: Adequate for Discharge   Problem: Health Behavior/Discharge Planning: Goal: Ability to manage health-related needs will improve Outcome: Adequate for Discharge   Problem: Clinical Measurements: Goal: Ability to maintain clinical measurements within normal limits will improve Outcome: Adequate for Discharge Goal: Will remain free from infection Outcome: Adequate for Discharge Goal: Diagnostic test results will improve Outcome: Adequate for Discharge Goal: Respiratory complications will improve Outcome: Adequate for Discharge Goal: Cardiovascular complication will be avoided Outcome: Adequate for Discharge   Problem: Activity: Goal: Risk for activity intolerance will decrease Outcome: Adequate for Discharge   Problem: Nutrition: Goal: Adequate nutrition will be maintained Outcome: Adequate for Discharge   Problem: Coping: Goal: Level of anxiety will decrease Outcome: Adequate for Discharge   Problem: Elimination: Goal: Will not experience complications related to bowel motility Outcome: Adequate for Discharge Goal: Will not experience complications related to urinary retention Outcome: Adequate for Discharge   Problem: Pain Managment: Goal: General experience of comfort will improve Outcome: Adequate for Discharge   Problem: Safety: Goal: Ability to remain free from injury will improve Outcome: Adequate for Discharge   Problem: Skin Integrity: Goal: Risk for impaired skin integrity will decrease Outcome: Adequate for Discharge

## 2022-09-18 NOTE — Evaluation (Signed)
Physical Therapy Evaluation Patient Details Name: Dalton Kelley MRN: [id number] DOB: [birth year] Today's Date: 09/18/2022  History of Present Illness  Patient is a 57 year old male who presented the the hospital originally on 8/31 with right wrist pain and L knee pain. Patient was d/c from ED on same day. Patient was called to return to hospital after positive blood cultures were received. Patient was admitted with concerns for bacteremia, acute gout flare. PMH: tobacco abuse, HTN, alcohol use.  Clinical Impression  Pt admitted with above diagnosis.  Pt currently with functional limitations due to the deficits listed below (see PT Problem List). Pt will benefit from acute skilled PT to increase their independence and safety with mobility to allow discharge.     The patient  ambulatory with crutches . Will practice steps prior to DC.      If plan is discharge home, recommend the following: Assistance with cooking/housework;Assist for transportation   Can travel by private vehicle        Equipment Recommendations None recommended by PT  Recommendations for Other Services       Functional Status Assessment Patient has had a recent decline in their functional status and demonstrates the ability to make significant improvements in function in a reasonable and predictable amount of time.     Precautions / Restrictions Precautions Precautions: Fall Restrictions Weight Bearing Restrictions: No      Mobility  Bed Mobility Overal bed mobility: Modified Independent                  Transfers Overall transfer level: Modified independent Equipment used: Crutches                    Ambulation/Gait Ambulation/Gait assistance: Supervision Gait Distance (Feet): 100 Feet Assistive device: Crutches Gait Pattern/deviations: Step-to pattern, Antalgic       General Gait Details: patient reports ambulating without crutches to BR, patient ambulated with crutches, noted  smoothe gait, adjusted  crutches to appropriate height  Stairs            Wheelchair Mobility     Tilt Bed    Modified Rankin (Stroke Patients Only)       Balance Overall balance assessment: Mild deficits observed, not formally tested                                           Pertinent Vitals/Pain Pain Assessment Pain Assessment: 0-10 Pain Score: 4  Pain Location: right hand, L knee a little Pain Descriptors / Indicators: Guarding, Constant Pain Intervention(s): Monitored during session    Home Living Family/patient expects to be discharged to:: Private residence Living Arrangements: Non-relatives/Friends Available Help at Discharge: Friend(s);Available PRN/intermittently Type of Home: Mobile home Home Access: Stairs to enter Entrance Stairs-Rails: None Entrance Stairs-Number of Steps: 4   Home Layout: One level Home Equipment: Crutches      Prior Function Prior Level of Function : Independent/Modified Independent;Working/employed;Driving                     Extremity/Trunk Assessment   Upper Extremity Assessment Upper Extremity Assessment: Defer to OT evaluation RUE Deficits / Details: patient was noted to have limited ROM of R wrist with less than 5 degrees of movment. patient with increased pain with movement. patient reported having a trigger finger on this hand but none observed at this time. patient was  educated on protecting joints with trigger history.    Lower Extremity Assessment Lower Extremity Assessment: RLE deficits/detail;LLE deficits/detail RLE Sensation: WNL LLE Deficits / Details: decreased knee flexion to ~ 70    Cervical / Trunk Assessment Cervical / Trunk Assessment: Normal  Communication   Communication Communication: No apparent difficulties  Cognition Arousal: Alert Behavior During Therapy: WFL for tasks assessed/performed Overall Cognitive Status: Within Functional Limits for tasks assessed                                           General Comments      Exercises     Assessment/Plan    PT Assessment Patient needs continued PT services  PT Problem List Decreased strength;Decreased activity tolerance;Decreased mobility;Decreased safety awareness;Decreased range of motion;Decreased knowledge of use of DME;Decreased knowledge of precautions;Pain       PT Treatment Interventions DME instruction;Functional mobility training;Gait training;Therapeutic activities;Therapeutic exercise;Patient/family education    PT Goals (Current goals can be found in the Care Plan section)  Acute Rehab PT Goals Patient Stated Goal: get back to work PT Goal Formulation: With patient Time For Goal Achievement: 09/25/22 Potential to Achieve Goals: Good    Frequency Min 1X/week     Co-evaluation PT/OT/SLP Co-Evaluation/Treatment: Yes Reason for Co-Treatment: To address functional/ADL transfers PT goals addressed during session: Mobility/safety with mobility OT goals addressed during session: ADL's and self-care       AM-PAC PT "6 Clicks" Mobility  Outcome Measure Help needed turning from your back to your side while in a flat bed without using bedrails?: None Help needed moving from lying on your back to sitting on the side of a flat bed without using bedrails?: None Help needed moving to and from a bed to a chair (including a wheelchair)?: None Help needed standing up from a chair using your arms (e.g., wheelchair or bedside chair)?: None Help needed to walk in hospital room?: None Help needed climbing 3-5 steps with a railing? : A Little 6 Click Score: 23    End of Session   Activity Tolerance: Patient tolerated treatment well Patient left: in chair;with call bell/phone within reach Nurse Communication: Mobility status PT Visit Diagnosis: Unsteadiness on feet (R26.81);Difficulty in walking, not elsewhere classified (R26.2);Pain Pain - Right/Left: Left Pain - part of  body: Knee    Time: 8657-8469 PT Time Calculation (min) (ACUTE ONLY): 18 min   Charges:   PT Evaluation $PT Eval Low Complexity: 1 Low   PT General Charges $$ ACUTE PT VISIT: 1 Visit         Blanchard Kelch PT Acute Rehabilitation Services Office [phone] Weekend pager-[phone]   Rada Hay 09/18/2022, 1:05 PM

## 2022-09-18 NOTE — Progress Notes (Signed)
  Nursing Discharge Note   Name: Dalton Kelley MRN: [id number] DOB: 1965/10/01    Admit Date: 09/17/2022   Discharge Date: 09/18/2022  Dalton Kelley is to be discharged home per MD order.  AVS completed. Reviewed with patient and family at bedside. Highlighted copy provided for patient to take home.  Patient  able to verbalize understanding of discharge instructions. PIV removed. Patient stable upon discharge.  Patient received medications from [facility]. Delivered to bedside and reviewed with patient new prescription for amlodipine, thiamine, folic acid, and nicotine patches. Stressed the importance of keeping follow-up appointment with new PCP.    Discharge Instructions     Diet general   Complete by: As directed    Increase activity slowly   Complete by: As directed        Allergies as of 09/18/2022   No Known Allergies      Medication List     TAKE these medications    amLODipine 10 MG tablet Commonly known as: NORVASC Take 1 tablet (10 mg total) by mouth daily. Start taking on: September 19, 2022   folic acid 1 MG tablet Commonly known as: FOLVITE Take 1 tablet (1 mg total) by mouth daily. Start taking on: September 19, 2022   mometasone-formoterol 200-5 MCG/ACT Aero Commonly known as: DULERA Inhale 2 puffs into the lungs 2 (two) times daily.   multivitamin with minerals Tabs tablet Take 1 tablet by mouth daily. Start taking on: September 19, 2022   nicotine 21 mg/24hr patch Commonly known as: NICODERM CQ - dosed in mg/24 hours Place 1 patch (21 mg total) onto the skin daily. Start taking on: September 19, 2022   oxyCODONE-acetaminophen 10-325 MG tablet Commonly known as: Percocet Take 1 tablet by mouth every 4 (four) hours as needed for up to 5 days for pain.   predniSONE 50 MG tablet Commonly known as: DELTASONE Take 1 tablet (50 mg total) by mouth daily with breakfast for 10 days.   thiamine 100 MG tablet Commonly known as: VITAMIN B1 Take  1 tablet (100 mg total) by mouth daily. Start taking on: September 19, 2022   umeclidinium bromide 62.5 MCG/ACT Aepb Commonly known as: INCRUSE ELLIPTA Inhale 1 puff into the lungs daily. Start taking on: September 19, 2022         Discharge Instructions/ Education: Discharge instructions given to patient with verbalized understanding. Discharge education completed with patient including: follow up instructions, medication list, discharge activities, and limitations if indicated. Patient able to verbalize understanding, all questions fully answered. Patient instructed to return to Emergency Department, call 911, or call MD for any changes in condition.  Patient escorted via wheelchair to lobby and discharged home via taxi cab with roommate.

## 2022-09-18 NOTE — TOC Transition Note (Signed)
Transition of Care [facility]) - CM/SW Discharge Note   Patient Details  Name: Dalton Kelley MRN: [id number] Date of Birth: [birth year]  [facility] CM/SW Contact:  Darleene Cleaver, LCSW Phone Number: 09/18/2022, 5:27 PM   Clinical Narrative:     CSW was informed that patient will need a PCP.  CSW contacted [facility] [address] [place], [place] [address], [phone] and set patient up with an appointment for 10/04/2022 at 10:40am.  CSW updated patient and his friend who was at bedside.  CSW provided substance abuse resources on patient's AVS, [facility] will provide medications for patient at bedside.  Patient did not express any other concerns or issues.  Patient's friend will be transporting him back home.  Final next level of care: Home/Self Care Barriers to Discharge: Barriers Resolved   Patient Goals and CMS Choice CMS Medicare.gov Compare Post Acute Care list provided to:: Patient Choice offered to / list presented to : Patient  Discharge Placement                      Patient and family notified of of transfer: 09/18/22  Discharge Plan and Services Additional resources added to the After Visit Summary for                  DME Arranged: Crutches                    Social Determinants of Health (SDOH) Interventions SDOH Screenings   Tobacco Use: High Risk (09/17/2022)     Readmission Risk Interventions     No data to display

## 2022-09-18 NOTE — Progress Notes (Signed)
Physical Therapy Treatment Patient Details Name: Dalton Kelley MRN: [id number] DOB: [birth year] Today's Date: 09/18/2022   History of Present Illness Patient is a 57 year old male who presented the the hospital originally on 8/31 with right wrist pain and L knee pain. Patient was d/c from ED on same day. Patient was called to return to hospital after positive blood cultures were received. Patient was admitted with concerns for bacteremia, acute gout flare. PMH: tobacco abuse, HTN, alcohol use.    PT Comments  Patient instructed and practiced 3steps using crutches, friend present on how to guard patient as he has no rails. No further PT needs at this time. PT signing  off/ patient to Dc home.    If plan is discharge home, recommend the following: Assistance with cooking/housework;Assist for transportation;Help with stairs or ramp for entrance   Can travel by private vehicle        Equipment Recommendations  None recommended by PT    Recommendations for Other Services       Precautions / Restrictions Precautions Precautions: Fall Restrictions Weight Bearing Restrictions: No     Mobility  Bed Mobility Overal bed mobility: Modified Independent                  Transfers Overall transfer level: Modified independent Equipment used: Crutches                    Ambulation/Gait Ambulation/Gait assistance: Modified independent (Device/Increase time) Gait Distance (Feet): 100 Feet Assistive device: Crutches Gait Pattern/deviations: Step-to pattern, Antalgic       General Gait Details: ambulates short distance without crutches, ambulated x 20' with cruthches   Stairs Stairs: Yes Stairs assistance: Min assist Stair Management: No rails, Step to pattern, Forwards, With crutches Number of Stairs: 3 General stair comments: friend present to  instruct on assist on steps, stand behind going up and in front going down.   Wheelchair Mobility     Tilt Bed     Modified Rankin (Stroke Patients Only)       Balance Overall balance assessment: Mild deficits observed, not formally tested                                          Cognition Arousal: Alert Behavior During Therapy: WFL for tasks assessed/performed Overall Cognitive Status: Within Functional Limits for tasks assessed                                          Exercises      General Comments        Pertinent Vitals/Pain Pain Assessment Pain Assessment: 0-10 Pain Score: 4  Pain Location: right hand, L knee a little Pain Descriptors / Indicators: Guarding, Constant Pain Intervention(s): Monitored during session    Home Living Family/patient expects to be discharged to:: Private residence Living Arrangements: Non-relatives/Friends Available Help at Discharge: Friend(s);Available PRN/intermittently Type of Home: Mobile home Home Access: Stairs to enter Entrance Stairs-Rails: None Entrance Stairs-Number of Steps: 4   Home Layout: One level Home Equipment: Crutches      Prior Function            PT Goals (current goals can now be found in the care plan section) Acute Rehab PT Goals Patient Stated Goal: get back to  work PT Goal Formulation: With patient Time For Goal Achievement: 09/25/22 Potential to Achieve Goals: Good Progress towards PT goals: Progressing toward goals    Frequency    Min 1X/week      PT Plan      Co-evaluation PT/OT/SLP Co-Evaluation/Treatment: Yes Reason for Co-Treatment: To address functional/ADL transfers PT goals addressed during session: Mobility/safety with mobility OT goals addressed during session: ADL's and self-care      AM-PAC PT "6 Clicks" Mobility   Outcome Measure  Help needed turning from your back to your side while in a flat bed without using bedrails?: None Help needed moving from lying on your back to sitting on the side of a flat bed without using bedrails?: None Help  needed moving to and from a bed to a chair (including a wheelchair)?: None Help needed standing up from a chair using your arms (e.g., wheelchair or bedside chair)?: None Help needed to walk in hospital room?: None Help needed climbing 3-5 steps with a railing? : A Little 6 Click Score: 23    End of Session   Activity Tolerance: Patient tolerated treatment well Patient left: in bed;with call bell/phone within reach;with family/visitor present Nurse Communication: Mobility status PT Visit Diagnosis: Unsteadiness on feet (R26.81);Difficulty in walking, not elsewhere classified (R26.2);Pain Pain - Right/Left: Left Pain - part of body: Knee     Time: 5284-1324 PT Time Calculation (min) (ACUTE ONLY): 11 min  Charges:    $Gait Training: 8-22 mins PT General Charges $$ ACUTE PT VISIT: 1 Visit                     Blanchard Kelch PT Acute Rehabilitation Services Office [phone] Weekend [phone]    Rada Hay 09/18/2022, 3:47 PM

## 2022-09-18 NOTE — Evaluation (Signed)
Occupational Therapy Evaluation Patient Details Name: Dalton Kelley MRN: [id number] DOB: 06-14-1965 Today's Date: 09/18/2022   History of Present Illness Patient is a 57 year old male who presented the the hospital originally on 8/31 with right wrist pain and L knee pain. Patient was d/c from ED on same day. Patient was called to return to hospital after positive blood cultures were received. Patient was admitted with concerns for bacteremia, acute gout flare. PMH: tobacco abuse, HTN, alcohol use.   Clinical Impression   Patient is a 57 year old male who was admitted for above. Patient was at home using crutches after recent presentation to ED. Patient continues to have pain in R wrist and L knee limiting participation. Patient was supervision with using crutches to get around room and hallway with patient declining to stand at sink for grooming tasks with therapist present. Provided with all needed items at this time. Patient could benefit from strategies for LB dressing tasks while ROM limited in L knee with pain. Patient would continue to benefit from skilled OT services at this time while admitted to address noted deficits in order to improve overall safety and independence in ADLs.        If plan is discharge home, recommend the following: Assistance with cooking/housework    Functional Status Assessment  Patient has had a recent decline in their functional status and demonstrates the ability to make significant improvements in function in a reasonable and predictable amount of time.  Equipment Recommendations  None recommended by OT       Precautions / Restrictions Restrictions Weight Bearing Restrictions: No      Mobility Bed Mobility Overal bed mobility: Modified Independent                      Balance Overall balance assessment: Mild deficits observed, not formally tested       ADL either performed or assessed with clinical judgement   ADL Overall ADL's :  Needs assistance/impaired Eating/Feeding: Modified independent;Sitting   Grooming: Sitting;Modified independent Grooming Details (indicate cue type and reason): patient declined to complete with therapist present reporting " i can to this myself". Upper Body Bathing: Sitting;Supervision/ safety   Lower Body Bathing: Supervison/ safety   Upper Body Dressing : Supervision/safety Upper Body Dressing Details (indicate cue type and reason): sitting EOB donning t shirt Lower Body Dressing: Supervision/safety   Toilet Transfer: Supervision/safety   Toileting- Clothing Manipulation and Hygiene: Supervision/safety               Vision Baseline Vision/History: 1 Wears glasses              Pertinent Vitals/Pain Pain Assessment Pain Assessment: 0-10 Pain Score: 5  Pain Location: right hand, L knee a little Pain Descriptors / Indicators: Guarding, Constant     Extremity/Trunk Assessment Upper Extremity Assessment Upper Extremity Assessment: RUE deficits/detail RUE Deficits / Details: patient was noted to have limited ROM of R wrist with less than 5 degrees of movment. patient with increased pain with movement. patient reported having a trigger finger on this hand but none observed at this time. patient was educated on protecting joints with trigger history.   Lower Extremity Assessment Lower Extremity Assessment: Defer to PT evaluation   Cervical / Trunk Assessment Cervical / Trunk Assessment: Normal   Communication     Cognition Arousal: Alert Behavior During Therapy: WFL for tasks assessed/performed Overall Cognitive Status: Within Functional Limits for tasks assessed  Home Living Family/patient expects to be discharged to:: Private residence   Available Help at Discharge: Friend(s);Available PRN/intermittently Type of Home: Mobile home Home Access: Stairs to enter Entrance Stairs-Number of Steps: 4 Entrance Stairs-Rails: None Home  Layout: One level     Bathroom Shower/Tub: Tub/shower unit         Home Equipment: Crutches          Prior Functioning/Environment Prior Level of Function : Independent/Modified Independent;Working/employed;Driving            OT Problem List: Decreased activity tolerance;Impaired balance (sitting and/or standing);Decreased coordination;Decreased safety awareness;Decreased knowledge of precautions;Impaired UE functional use      OT Treatment/Interventions: Self-care/ADL training;DME and/or AE instruction;Patient/family education;Therapeutic activities;Therapeutic exercise    OT Goals(Current goals can be found in the care plan section) Acute Rehab OT Goals Patient Stated Goal: to get pain under control OT Goal Formulation: With patient Time For Goal Achievement: 10/02/22 Potential to Achieve Goals: Fair  OT Frequency: Min 1X/week    Co-evaluation PT/OT/SLP Co-Evaluation/Treatment: Yes Reason for Co-Treatment: To address functional/ADL transfers PT goals addressed during session: Mobility/safety with mobility OT goals addressed during session: ADL's and self-care      AM-PAC OT "6 Clicks" Daily Activity     Outcome Measure Help from another person eating meals?: None Help from another person taking care of personal grooming?: None Help from another person toileting, which includes using toliet, bedpan, or urinal?: A Little Help from another person bathing (including washing, rinsing, drying)?: A Little Help from another person to put on and taking off regular upper body clothing?: None Help from another person to put on and taking off regular lower body clothing?: A Little 6 Click Score: 21   End of Session Equipment Utilized During Treatment: Gait belt;Rolling walker (2 wheels) Nurse Communication: Mobility status  Activity Tolerance: Patient tolerated treatment well;Patient limited by pain Patient left: in chair;with call bell/phone within reach  OT Visit  Diagnosis: Unsteadiness on feet (R26.81);Pain                Time: 9629-5284 OT Time Calculation (min): 18 min Charges:  OT General Charges $OT Visit: 1 Visit OT Evaluation $OT Eval Low Complexity: 1 Low  Kayonna Lawniczak OTR/L, MS Acute Rehabilitation Department Office# [phone]   Selinda Flavin 09/18/2022, 12:50 PM

## 2022-09-19 LAB — BODY FLUID CULTURE W GRAM STAIN: Culture: NO GROWTH

## 2022-09-20 LAB — GC/CHLAMYDIA PROBE AMP (~~LOC~~) NOT AT ARMC
Chlamydia: NEGATIVE
Comment: NEGATIVE
Comment: NORMAL
Neisseria Gonorrhea: NEGATIVE

## 2022-09-21 LAB — CULTURE, BLOOD (ROUTINE X 2)
Culture: NO GROWTH
Special Requests: ADEQUATE

## 2022-10-04 ENCOUNTER — Ambulatory Visit (INDEPENDENT_AMBULATORY_CARE_PROVIDER_SITE_OTHER): Payer: 59 | Admitting: Family Medicine

## 2022-10-04 ENCOUNTER — Other Ambulatory Visit: Payer: Self-pay

## 2022-10-04 ENCOUNTER — Encounter: Payer: Self-pay | Admitting: Family Medicine

## 2022-10-04 VITALS — BP 180/100 | HR 98 | Temp 98.2°F | Ht 69.0 in | Wt 190.0 lb

## 2022-10-04 DIAGNOSIS — R7881 Bacteremia: Secondary | ICD-10-CM

## 2022-10-04 DIAGNOSIS — R030 Elevated blood-pressure reading, without diagnosis of hypertension: Secondary | ICD-10-CM

## 2022-10-04 DIAGNOSIS — M10031 Idiopathic gout, right wrist: Secondary | ICD-10-CM

## 2022-10-04 DIAGNOSIS — Z789 Other specified health status: Secondary | ICD-10-CM

## 2022-10-04 DIAGNOSIS — E876 Hypokalemia: Secondary | ICD-10-CM | POA: Diagnosis not present

## 2022-10-04 DIAGNOSIS — I10 Essential (primary) hypertension: Secondary | ICD-10-CM

## 2022-10-04 DIAGNOSIS — Z7689 Persons encountering health services in other specified circumstances: Secondary | ICD-10-CM | POA: Diagnosis not present

## 2022-10-04 DIAGNOSIS — M10062 Idiopathic gout, left knee: Secondary | ICD-10-CM

## 2022-10-04 MED ORDER — COLCHICINE 0.6 MG PO TABS
0.6000 mg | ORAL_TABLET | Freq: Two times a day (BID) | ORAL | 3 refills | Status: DC
Start: 2022-10-04 — End: 2022-10-04

## 2022-10-04 MED ORDER — OLMESARTAN MEDOXOMIL 20 MG PO TABS
20.0000 mg | ORAL_TABLET | Freq: Every day | ORAL | 3 refills | Status: DC
Start: 2022-10-04 — End: 2022-11-28

## 2022-10-04 MED ORDER — COLCHICINE 0.6 MG PO TABS: 0.6000 mg | ORAL_TABLET | Freq: Two times a day (BID) | ORAL | 3 refills | Status: DC

## 2022-10-04 MED ORDER — OLMESARTAN MEDOXOMIL 20 MG PO TABS: 20.0000 mg | ORAL_TABLET | Freq: Every day | ORAL | 3 refills | Status: DC

## 2022-10-04 NOTE — Progress Notes (Signed)
I,Jameka J Llittleton, CMA,acting as a Neurosurgeon for [employer], NP.,have documented all relevant documentation on the behalf of Dalton Hose, NP,as directed by  Dalton Hose, NP while in the presence of Dalton Hose, NP.  Subjective:  Patient ID: Dalton Kelley , male    DOB: [birth year] , 57 y.o.   MRN: [id number]  Chief Complaint  Patient presents with   Establish Care   hospital f/u    HPI  Patient is a 57 year old male presents today to establish primary care. Patient went to the ED on 8/31 for painful,swollen joints. He had fluid pulled from joints and when results came out and patient was admitted in the hospital on 9/2/-9/3 for Bacteremia(Positive Blood Culture ) .  Patient reports he was given  Amlodipine 10 mg for elevated BP at the hospital but he stopped after 4 days because he had an adverse reaction to it. It cause extreme swelling of his lower extremities. Both ankles and feet are still swollen both he states they are much better compared to when he was still taking Amlodipine.Patient is accompanied by friend and boss.      History reviewed. No pertinent past medical history.   Family History  Problem Relation Age of Onset   Cancer Mother    Kidney cancer Father    Heart attack Paternal Grandfather      Current Outpatient Medications:    folic acid (FOLVITE) 1 MG tablet, Take 1 tablet (1 mg total) by mouth daily., Disp: 30 tablet, Rfl: 1   thiamine (VITAMIN B-1) 100 MG tablet, Take 1 tablet (100 mg total) by mouth daily., Disp: 30 tablet, Rfl: 1   amLODipine (NORVASC) 10 MG tablet, Take 1 tablet (10 mg total) by mouth daily. (Patient not taking: Reported on 10/04/2022), Disp: 30 tablet, Rfl: 1   colchicine 0.6 MG tablet, Take 1 tablet (0.6 mg total) by mouth 2 (two) times daily., Disp: 60 tablet, Rfl: 3   mometasone-formoterol (DULERA) 200-5 MCG/ACT AERO, Inhale 2 puffs into the lungs 2 (two) times daily. (Patient not taking: Reported on 10/04/2022), Disp: , Rfl:    Multiple  Vitamin (MULTIVITAMIN WITH MINERALS) TABS tablet, Take 1 tablet by mouth daily. (Patient not taking: Reported on 10/04/2022), Disp: , Rfl:    nicotine (NICODERM CQ - DOSED IN MG/24 HOURS) 21 mg/24hr patch, Place 1 patch (21 mg total) onto the skin daily. (Patient not taking: Reported on 10/04/2022), Disp: 28 patch, Rfl: 1   olmesartan (BENICAR) 20 MG tablet, Take 1 tablet (20 mg total) by mouth daily., Disp: 30 tablet, Rfl: 3   umeclidinium bromide (INCRUSE ELLIPTA) 62.5 MCG/ACT AEPB, Inhale 1 puff into the lungs daily. (Patient not taking: Reported on 10/04/2022), Disp: , Rfl:    No Known Allergies   Review of Systems  Constitutional: Negative.   HENT: Negative.    Eyes: Negative.   Cardiovascular:  Positive for leg swelling. Negative for chest pain.  Musculoskeletal:  Positive for arthralgias and joint swelling.  Skin: Negative.   Psychiatric/Behavioral: Negative.       Today's Vitals   10/04/22 1052 10/04/22 1144  BP: (!) 164/100 (!) 180/100  Pulse: 98   Temp: 98.2 F (36.8 C)   SpO2: 98%   Weight: 190 lb 0.6 oz (86.2 kg)   Height: 5\' 9"  (1.753 m)    Body mass index is 28.06 kg/m.  Wt Readings from Last 3 Encounters:  10/04/22 190 lb 0.6 oz (86.2 kg)  09/17/22 190 lb (86.2 kg)  02/13/14  180 lb (81.6 kg)    The ASCVD Risk score (Arnett DK, et al., 2019) failed to calculate for the following reasons:   Cannot find a previous HDL lab   Cannot find a previous total cholesterol lab  Objective:  Physical Exam Cardiovascular:     Rate and Rhythm: Normal rate.  Pulmonary:     Effort: Pulmonary effort is normal.     Breath sounds: Normal breath sounds.  Musculoskeletal:        General: Swelling and tenderness present.     Right lower leg: Edema present.     Left lower leg: Edema present.  Neurological:     Mental Status: He is alert.         Assessment And Plan:  Primary hypertension Assessment & Plan: Start Olmesartan 20 mg every day. Low salt diet  advised   Establishing care with new doctor, encounter for  Acute idiopathic gout of right wrist Assessment & Plan: Start Colchicine 0.6mg  BID prophylaxis   Hypokalemia Assessment & Plan: Recheck labs  Orders: -     CMP14+EGFR -     CBC with Differential/Platelet    Return in about 2 weeks (around 10/18/2022) for nurse visit, blood pressure.  Patient was given opportunity to ask questions. Patient verbalized understanding of the plan and was able to repeat key elements of the plan. All questions were answered to their satisfaction.    I, Dalton Hose, NP, have reviewed all documentation for this visit. The documentation on 10/09/22 for the exam, diagnosis, procedures, and orders are all accurate and complete.   IF YOU HAVE BEEN REFERRED TO A SPECIALIST, IT MAY TAKE 1-2 WEEKS TO SCHEDULE/PROCESS THE REFERRAL. IF YOU HAVE NOT HEARD FROM US/SPECIALIST IN TWO WEEKS, PLEASE GIVE [place] A CALL AT [phone].

## 2022-10-05 LAB — CMP14+EGFR
ALT: 25 IU/L (ref 0–44)
AST: 33 IU/L (ref 0–40)
Albumin: 3.8 g/dL (ref 3.8–4.9)
Alkaline Phosphatase: 124 IU/L — ABNORMAL HIGH (ref 44–121)
BUN/Creatinine Ratio: 5 — ABNORMAL LOW (ref 9–20)
BUN: 4 mg/dL — ABNORMAL LOW (ref 6–24)
Bilirubin Total: <0.2 mg/dL (ref 0.0–1.2)
CO2: 22 mmol/L (ref 20–29)
Calcium: 9.1 mg/dL (ref 8.7–10.2)
Chloride: 100 mmol/L (ref 96–106)
Creatinine, Ser: 0.83 mg/dL (ref 0.76–1.27)
Globulin, Total: 3.4 g/dL (ref 1.5–4.5)
Glucose: 83 mg/dL (ref 70–99)
Potassium: 4.1 mmol/L (ref 3.5–5.2)
Sodium: 142 mmol/L (ref 134–144)
Total Protein: 7.2 g/dL (ref 6.0–8.5)
eGFR: 101 mL/min/{1.73_m2} (ref >59)

## 2022-10-05 LAB — CBC WITH DIFFERENTIAL/PLATELET
Basophils Absolute: 0.1 10*3/uL (ref 0.0–0.2)
Basos: 1 %
EOS (ABSOLUTE): 0.2 10*3/uL (ref 0.0–0.4)
Eos: 2 %
Hematocrit: 39.1 % (ref 37.5–51.0)
Hemoglobin: 13.6 g/dL (ref 13.0–17.7)
Immature Grans (Abs): 0.1 10*3/uL (ref 0.0–0.1)
Immature Granulocytes: 1 %
Lymphocytes Absolute: 1.4 10*3/uL (ref 0.7–3.1)
Lymphs: 12 %
MCH: 35.1 pg — ABNORMAL HIGH (ref 26.6–33.0)
MCHC: 34.8 g/dL (ref 31.5–35.7)
MCV: 101 fL — ABNORMAL HIGH (ref 79–97)
Monocytes Absolute: 0.6 10*3/uL (ref 0.1–0.9)
Monocytes: 6 %
Neutrophils Absolute: 9.3 10*3/uL — ABNORMAL HIGH (ref 1.4–7.0)
Neutrophils: 78 %
Platelets: 311 10*3/uL (ref 150–450)
RBC: 3.87 x10E6/uL — ABNORMAL LOW (ref 4.14–5.80)
RDW: 14.6 % (ref 11.6–15.4)
WBC: 11.6 10*3/uL — ABNORMAL HIGH (ref 3.4–10.8)

## 2022-10-09 DIAGNOSIS — I10 Essential (primary) hypertension: Secondary | ICD-10-CM | POA: Insufficient documentation

## 2022-10-09 LAB — MISC LABCORP TEST (SEND OUT): Labcorp test code: 183402

## 2022-10-09 NOTE — Assessment & Plan Note (Signed)
Start Olmesartan 20 mg every day. Low salt diet advised

## 2022-10-09 NOTE — Assessment & Plan Note (Signed)
Recheck labs

## 2022-10-09 NOTE — Assessment & Plan Note (Signed)
Start Colchicine 0.6mg  BID prophylaxis

## 2022-10-12 LAB — CULTURE, BLOOD (ROUTINE X 2): Special Requests: ADEQUATE

## 2022-10-16 ENCOUNTER — Ambulatory Visit: Payer: 59

## 2022-10-16 VITALS — BP 150/100 | HR 89 | Temp 98.4°F | Ht 69.0 in | Wt 190.0 lb

## 2022-10-16 DIAGNOSIS — I10 Essential (primary) hypertension: Secondary | ICD-10-CM

## 2022-10-16 NOTE — Progress Notes (Signed)
Patient presents today for nurse visit bp check. Patient reports he is taking olmesartan 20mg  in the evenings. I checked his blood pressure and it was 160/120 p84. I waited 10 minutes and checked his blood pressure again and it was 150/100 p89. After speaking with provider she advised patient to increase his olmesartan 20mg  to 40mg  once daily. Patient was advised to take 2 tablets of the olmesartan 20mg  until he is done and then call us back so we can send in the prescription. Pt is to follow up with [place] in 6 weeks. YL,rMA   BP Readings from Last 3 Encounters:  10/04/22 (!) 180/100  09/18/22 (!) 165/111  09/16/22 (!) 160/97

## 2022-11-28 ENCOUNTER — Encounter: Payer: Self-pay | Admitting: Family Medicine

## 2022-11-28 ENCOUNTER — Ambulatory Visit: Payer: 59 | Admitting: Family Medicine

## 2022-11-28 VITALS — BP 120/80 | HR 97 | Temp 98.1°F | Ht 69.0 in | Wt 196.0 lb

## 2022-11-28 DIAGNOSIS — M1A00X0 Idiopathic chronic gout, unspecified site, without tophus (tophi): Secondary | ICD-10-CM

## 2022-11-28 DIAGNOSIS — I10 Essential (primary) hypertension: Secondary | ICD-10-CM | POA: Diagnosis not present

## 2022-11-28 MED ORDER — OLMESARTAN MEDOXOMIL 5 MG PO TABS: 5.0000 mg | ORAL_TABLET | Freq: Every day | ORAL | 4 refills | Status: AC

## 2022-11-28 NOTE — Progress Notes (Signed)
I,Jameka J Llittleton, CMA,acting as a Neurosurgeon for [employer], NP.,have documented all relevant documentation on the behalf of Ellender Hose, NP,as directed by  Ellender Hose, NP while in the presence of Ellender Hose, NP.  Subjective:  Patient ID: Dalton Kelley , male    DOB: 1965/07/16 , 57 y.o.   MRN: [id number]  Chief Complaint  Patient presents with   Hypertension    HPI  Patient is a 57 year male who presents today for hypertension management. Patient reports that when he took Benicar 40mg  , it made him very dizzy so he stopped taking his BP medications. Advised patient that I will change the dose to 5 mg.     History reviewed. No pertinent past medical history.   Family History  Problem Relation Age of Onset   Cancer Mother    Kidney cancer Father    Heart attack Paternal Grandfather      Current Outpatient Medications:    colchicine 0.6 MG tablet, Take 1 tablet (0.6 mg total) by mouth 2 (two) times daily., Disp: 60 tablet, Rfl: 3   olmesartan (BENICAR) 5 MG tablet, Take 1 tablet (5 mg total) by mouth daily., Disp: 30 tablet, Rfl: 4   amLODipine (NORVASC) 10 MG tablet, Take 1 tablet (10 mg total) by mouth daily. (Patient not taking: Reported on 10/04/2022), Disp: 30 tablet, Rfl: 1   folic acid (FOLVITE) 1 MG tablet, Take 1 tablet (1 mg total) by mouth daily. (Patient not taking: Reported on 11/28/2022), Disp: 30 tablet, Rfl: 1   mometasone-formoterol (DULERA) 200-5 MCG/ACT AERO, Inhale 2 puffs into the lungs 2 (two) times daily. (Patient not taking: Reported on 10/04/2022), Disp: , Rfl:    Multiple Vitamin (MULTIVITAMIN WITH MINERALS) TABS tablet, Take 1 tablet by mouth daily. (Patient not taking: Reported on 10/04/2022), Disp: , Rfl:    nicotine (NICODERM CQ - DOSED IN MG/24 HOURS) 21 mg/24hr patch, Place 1 patch (21 mg total) onto the skin daily. (Patient not taking: Reported on 10/04/2022), Disp: 28 patch, Rfl: 1   thiamine (VITAMIN B-1) 100 MG tablet, Take 1 tablet (100 mg total) by  mouth daily. (Patient not taking: Reported on 11/28/2022), Disp: 30 tablet, Rfl: 1   umeclidinium bromide (INCRUSE ELLIPTA) 62.5 MCG/ACT AEPB, Inhale 1 puff into the lungs daily. (Patient not taking: Reported on 10/04/2022), Disp: , Rfl:    No Known Allergies   Review of Systems  Constitutional: Negative.   Eyes: Negative.   Respiratory: Negative.    Cardiovascular: Negative.   Musculoskeletal: Negative.   Skin: Negative.   Psychiatric/Behavioral: Negative.       Today's Vitals   11/28/22 0944  BP: 120/80  Pulse: 97  Temp: 98.1 F (36.7 C)  Weight: 196 lb (88.9 kg)  Height: 5\' 9"  (1.753 m)  PainSc: 0-No pain   Body mass index is 28.94 kg/m.  Wt Readings from Last 3 Encounters:  11/28/22 196 lb (88.9 kg)  10/16/22 190 lb (86.2 kg)  10/04/22 190 lb 0.6 oz (86.2 kg)    The ASCVD Risk score (Arnett DK, et al., 2019) failed to calculate for the following reasons:   Cannot find a previous HDL lab   Cannot find a previous total cholesterol lab  Objective:  Physical Exam Cardiovascular:     Rate and Rhythm: Normal rate.  Abdominal:     General: Bowel sounds are normal.  Skin:    General: Skin is warm and dry.  Neurological:     Mental Status: He is alert  and oriented to person, place, and time.        Assessment And Plan:  Primary hypertension  Other orders -     Olmesartan Medoxomil; Take 1 tablet (5 mg total) by mouth daily.  Dispense: 30 tablet; Refill: 4    Return in 4 months (on 03/28/2023), or if symptoms worsen or fail to improve, for physical.  Patient was given opportunity to ask questions. Patient verbalized understanding of the plan and was able to repeat key elements of the plan. All questions were answered to their satisfaction.    I, Ellender Hose, NP, have reviewed all documentation for this visit. The documentation on 11/28/22 for the exam, diagnosis, procedures, and orders are all accurate and complete.   IF YOU HAVE BEEN REFERRED TO A SPECIALIST,  IT MAY TAKE 1-2 WEEKS TO SCHEDULE/PROCESS THE REFERRAL. IF YOU HAVE NOT HEARD FROM US/SPECIALIST IN TWO WEEKS, PLEASE GIVE [place] A CALL AT [phone].

## 2022-12-04 ENCOUNTER — Encounter: Payer: Self-pay | Admitting: Family Medicine

## 2022-12-04 DIAGNOSIS — M1A00X0 Idiopathic chronic gout, unspecified site, without tophus (tophi): Secondary | ICD-10-CM | POA: Insufficient documentation

## 2022-12-04 NOTE — Assessment & Plan Note (Signed)
No flare ups, but advised patient to continue taking colchine 0.6mg  daily prophylaxis

## 2022-12-26 ENCOUNTER — Other Ambulatory Visit: Payer: Self-pay | Admitting: Family Medicine

## 2022-12-26 DIAGNOSIS — M10031 Idiopathic gout, right wrist: Secondary | ICD-10-CM

## 2023-03-28 ENCOUNTER — Encounter: Payer: 59 | Admitting: Family Medicine

## 2023-08-04 ENCOUNTER — Emergency Department (HOSPITAL_COMMUNITY): Payer: Self-pay

## 2023-08-04 ENCOUNTER — Emergency Department (HOSPITAL_COMMUNITY)
Admission: EM | Admit: 2023-08-04 | Discharge: 2023-08-04 | Disposition: A | Discharge status: Home / Self Care | Payer: Self-pay | Attending: Emergency Medicine | Admitting: Emergency Medicine

## 2023-08-04 ENCOUNTER — Other Ambulatory Visit: Payer: Self-pay

## 2023-08-04 DIAGNOSIS — M10061 Idiopathic gout, right knee: Secondary | ICD-10-CM | POA: Insufficient documentation

## 2023-08-04 DIAGNOSIS — Y939 Activity, unspecified: Secondary | ICD-10-CM | POA: Insufficient documentation

## 2023-08-04 DIAGNOSIS — M109 Gout, unspecified: Secondary | ICD-10-CM

## 2023-08-04 DIAGNOSIS — M7022 Olecranon bursitis, left elbow: Secondary | ICD-10-CM | POA: Insufficient documentation

## 2023-08-04 MED ORDER — PREDNISONE 10 MG PO TABS
ORAL_TABLET | ORAL | 0 refills | Status: AC
Start: 2023-08-04 — End: ?

## 2023-08-04 MED ORDER — INDOMETHACIN 25 MG PO CAPS
25.0000 mg | ORAL_CAPSULE | Freq: Three times a day (TID) | ORAL | 0 refills | Status: AC | PRN
Start: 2023-08-04 — End: ?

## 2023-08-04 NOTE — ED Triage Notes (Signed)
 Pt presents to the ED for Gout flare up. Pt states that his symptoms started roughly a few months ago. Pt no longer has health insurance and has not been able to take his gout medication. Pt states that the pain began intensifying a few weeks ago. Pt's edema in the left elbow and right knee.

## 2023-08-04 NOTE — ED Provider Notes (Signed)
 [place] EM[facility]ENC[facility] DEPARTMENT AT [facility] Provider Note   CSN: [id number] Arrival date & time: 08/04/23  9340     Patient presents with: GOUT FLARE UP   Dalton Kelley is a 58 y.o. male.   Patient complains of swelling to his left elbow.  Patient reports that he noticed several months ago but the swelling has increased in size.  Patient reports that he also has pain in his right knee.  He has a history of gout in his right knee and feels like he is having an exacerbation.  Patient was previously on colchicine  but he lost his insurance and cannot afford colchicine .  Patient denies any fever or chills he denies any redness or swelling.  The history is provided by the patient. No language interpreter was used.       Prior to Admission medications   Medication Sig Start Date End Date Taking? Authorizing Provider  indomethacin  (INDOCIN ) 25 MG capsule Take 1 capsule (25 mg total) by mouth 3 (three) times daily as needed for moderate pain (pain score 4-6). 08/04/23  Yes Flint Raring K, PA-C  predniSONE  (DELTASONE ) 10 MG tablet 6,5,4,3,2,1 taper 08/04/23  Yes Nuri Branca K, PA-C  amLODipine  (NORVASC ) 10 MG tablet Take 1 tablet (10 mg total) by mouth daily. Patient not taking: Reported on 10/04/2022 09/19/22   Sebastian Toribio GAILS, MD  colchicine  0.6 MG tablet TAKE 1 TABLET BY MOUTH 2 TIMES DAILY. 12/28/22   Petrina Pries, NP  folic acid  (FOLVITE ) 1 MG tablet Take 1 tablet (1 mg total) by mouth daily. Patient not taking: Reported on 11/28/2022 09/19/22   Sebastian Toribio GAILS, MD  mometasone -formoterol  (DULERA ) 200-5 MCG/ACT AERO Inhale 2 puffs into the lungs 2 (two) times daily. Patient not taking: Reported on 10/04/2022 09/18/22   Sebastian Toribio GAILS, MD  Multiple Vitamin (MULTIVITAMIN WITH MINERALS) TABS tablet Take 1 tablet by mouth daily. Patient not taking: Reported on 10/04/2022 09/19/22   Sebastian Toribio GAILS, MD  nicotine  (NICODERM CQ  - DOSED IN MG/24 HOURS) 21 mg/24hr patch Place 1  patch (21 mg total) onto the skin daily. Patient not taking: Reported on 10/04/2022 09/19/22   Sebastian Toribio GAILS, MD  olmesartan  (BENICAR ) 5 MG tablet Take 1 tablet (5 mg total) by mouth daily. 11/28/22   Petrina Pries, NP  thiamine  (VITAMIN B-1) 100 MG tablet Take 1 tablet (100 mg total) by mouth daily. Patient not taking: Reported on 11/28/2022 09/19/22   Sebastian Toribio GAILS, MD  umeclidinium bromide  (INCRUSE ELLIPTA ) 62.5 MCG/ACT AEPB Inhale 1 puff into the lungs daily. Patient not taking: Reported on 10/04/2022 09/19/22   Sebastian Toribio GAILS, MD    Allergies: Patient has no known allergies.    Review of Systems  All other systems reviewed and are negative.   Updated Vital Signs BP (!) 132/90 (BP Location: Right Arm)   Pulse 99   Temp 97.8 F (36.6 C) (Oral)   Resp 17   Ht 5' 9 (1.753 m)   Wt 88.9 kg   SpO2 99%   BMI 28.94 kg/m   Physical Exam Vitals and nursing note reviewed.  Constitutional:      Appearance: He is well-developed.  HENT:     Head: Normocephalic.  Cardiovascular:     Rate and Rhythm: Normal rate.  Pulmonary:     Effort: Pulmonary effort is normal.  Abdominal:     General: Abdomen is flat.     Tenderness: There is no abdominal tenderness.  Musculoskeletal:  General: Swelling and tenderness present.     Cervical back: Normal range of motion.     Comments: Swelling right knee, no effusion,   Left elbow shows large bursa,  no erythema,  no sign of infection.    Skin:    General: Skin is warm.  Neurological:     General: No focal deficit present.     Mental Status: He is alert and oriented to person, place, and time.     (all labs ordered are listed, but only abnormal results are displayed) Labs Reviewed - No data to display  EKG: None  Radiology: DG Elbow Complete Left Result Date: 08/04/2023 CLINICAL DATA:  Swelling.  History of gout. EXAM: LEFT ELBOW - COMPLETE 3+ VIEW COMPARISON:  None. FINDINGS: Marked focal soft tissue swelling overlying  the dorsum of the elbow is identified. No underlying fracture or dislocation. No focal bone erosions. IMPRESSION: 1. Marked focal soft tissue swelling overlying the dorsum of the elbow. Imaging findings are compatible with olecranon bursitis. 2. No acute bone abnormality. Electronically Signed   By: Waddell Calk M.D.   On: 08/04/2023 07:57     Procedures   Medications Ordered in the ED - No data to display                                  Medical Decision Making Patient complains of swelling to his right knee.  Patient has history of gout.  Patient complains of swelling to his left elbow that he has had for several weeks.  Amount and/or Complexity of Data Reviewed Radiology: ordered and independent interpretation performed. Decision-making details documented in ED Course.    Details: X-ray left elbow shows bursal enlargement  Risk Prescription drug management. Risk Details: Patient has not had any evidence of infection.  He appears to have olecranon bursitis in his left elbow.  Patient has no gout in his right knee.  Patient is given a prescription for prednisone  and indomethacin .  Patient is advised to follow-up primary care physician for recheck         Final diagnoses:  Olecranon bursitis of left elbow  Acute gout of right knee, unspecified cause    ED Discharge Orders          Ordered    predniSONE  (DELTASONE ) 10 MG tablet       Note to Pharmacy: PLease provide 6 day taper dose pack.   08/04/23 0821    indomethacin  (INDOCIN ) 25 MG capsule  3 times daily PRN        08/04/23 9175           An After Visit Summary was printed and given to the patient.     [name] 08/04/23 1509    Dasie Faden, MD 08/05/23 1027

## 2023-10-29 ENCOUNTER — Other Ambulatory Visit: Payer: Self-pay | Admitting: Family Medicine

## 2023-10-29 DIAGNOSIS — M10031 Idiopathic gout, right wrist: Secondary | ICD-10-CM

## 2023-10-29 DIAGNOSIS — I10 Essential (primary) hypertension: Secondary | ICD-10-CM

## 2023-12-05 ENCOUNTER — Other Ambulatory Visit: Payer: Self-pay | Admitting: Nurse Practitioner

## 2023-12-05 ENCOUNTER — Ambulatory Visit
Admission: RE | Admit: 2023-12-05 | Discharge: 2023-12-05 | Disposition: A | Discharge status: Home / Self Care | Source: Ambulatory Visit | Attending: Nurse Practitioner | Admitting: Nurse Practitioner

## 2023-12-05 DIAGNOSIS — M545 Low back pain, unspecified: Secondary | ICD-10-CM
